# Patient Record
Sex: Female | Born: 1935 | Race: White | Hispanic: No | Marital: Married | Smoking: Never smoker
Health system: Southern US, Community
[De-identification: ages and names within clinical notes are randomized; demographics above are authoritative.]

## PROBLEM LIST (undated history)

## (undated) DIAGNOSIS — G309 Alzheimer's disease, unspecified: Secondary | ICD-10-CM

## (undated) DIAGNOSIS — F0280 Dementia in other diseases classified elsewhere without behavioral disturbance: Secondary | ICD-10-CM

## (undated) DIAGNOSIS — E785 Hyperlipidemia, unspecified: Secondary | ICD-10-CM

## (undated) DIAGNOSIS — F329 Major depressive disorder, single episode, unspecified: Secondary | ICD-10-CM

## (undated) DIAGNOSIS — I10 Essential (primary) hypertension: Secondary | ICD-10-CM

## (undated) DIAGNOSIS — F32A Depression, unspecified: Secondary | ICD-10-CM

---

## 2009-10-20 ENCOUNTER — Emergency Department (HOSPITAL_COMMUNITY): Admission: EM | Admit: 2009-10-20 | Discharge: 2009-10-20 | Payer: Self-pay | Admitting: Emergency Medicine

## 2010-03-01 ENCOUNTER — Emergency Department (HOSPITAL_BASED_OUTPATIENT_CLINIC_OR_DEPARTMENT_OTHER): Admission: EM | Admit: 2010-03-01 | Discharge: 2010-03-01 | Payer: Self-pay | Admitting: Emergency Medicine

## 2010-05-01 ENCOUNTER — Emergency Department (HOSPITAL_BASED_OUTPATIENT_CLINIC_OR_DEPARTMENT_OTHER): Admission: EM | Admit: 2010-05-01 | Discharge: 2010-05-01 | Payer: Self-pay | Admitting: Emergency Medicine

## 2011-06-10 ENCOUNTER — Emergency Department (HOSPITAL_COMMUNITY): Payer: Medicare Other

## 2011-06-10 ENCOUNTER — Emergency Department (HOSPITAL_COMMUNITY)
Admission: EM | Admit: 2011-06-10 | Discharge: 2011-06-10 | Disposition: A | Discharge status: Home / Self Care | Payer: Medicare Other | Attending: Emergency Medicine | Admitting: Emergency Medicine

## 2011-06-10 DIAGNOSIS — R51 Headache: Secondary | ICD-10-CM | POA: Insufficient documentation

## 2011-06-10 DIAGNOSIS — M949 Disorder of cartilage, unspecified: Secondary | ICD-10-CM | POA: Insufficient documentation

## 2011-06-10 DIAGNOSIS — Z23 Encounter for immunization: Secondary | ICD-10-CM | POA: Insufficient documentation

## 2011-06-10 DIAGNOSIS — F0390 Unspecified dementia without behavioral disturbance: Secondary | ICD-10-CM | POA: Insufficient documentation

## 2011-06-10 DIAGNOSIS — Y9210 Unspecified residential institution as the place of occurrence of the external cause: Secondary | ICD-10-CM | POA: Insufficient documentation

## 2011-06-10 DIAGNOSIS — W19XXXA Unspecified fall, initial encounter: Secondary | ICD-10-CM | POA: Insufficient documentation

## 2011-06-10 DIAGNOSIS — S0100XA Unspecified open wound of scalp, initial encounter: Secondary | ICD-10-CM | POA: Insufficient documentation

## 2011-06-10 DIAGNOSIS — M19049 Primary osteoarthritis, unspecified hand: Secondary | ICD-10-CM | POA: Insufficient documentation

## 2011-06-10 DIAGNOSIS — M899 Disorder of bone, unspecified: Secondary | ICD-10-CM | POA: Insufficient documentation

## 2011-06-10 DIAGNOSIS — M47812 Spondylosis without myelopathy or radiculopathy, cervical region: Secondary | ICD-10-CM | POA: Insufficient documentation

## 2011-06-10 DIAGNOSIS — T148XXA Other injury of unspecified body region, initial encounter: Secondary | ICD-10-CM | POA: Insufficient documentation

## 2013-07-23 ENCOUNTER — Emergency Department (HOSPITAL_COMMUNITY)
Admission: EM | Admit: 2013-07-23 | Discharge: 2013-07-23 | Disposition: A | Discharge status: Home / Self Care | Payer: Medicare Other | Attending: Emergency Medicine | Admitting: Emergency Medicine

## 2013-07-23 ENCOUNTER — Encounter (HOSPITAL_COMMUNITY): Payer: Self-pay | Admitting: Emergency Medicine

## 2013-07-23 DIAGNOSIS — Y9389 Activity, other specified: Secondary | ICD-10-CM | POA: Insufficient documentation

## 2013-07-23 DIAGNOSIS — F0390 Unspecified dementia without behavioral disturbance: Secondary | ICD-10-CM | POA: Insufficient documentation

## 2013-07-23 DIAGNOSIS — W1809XA Striking against other object with subsequent fall, initial encounter: Secondary | ICD-10-CM | POA: Insufficient documentation

## 2013-07-23 DIAGNOSIS — S6990XA Unspecified injury of unspecified wrist, hand and finger(s), initial encounter: Secondary | ICD-10-CM | POA: Insufficient documentation

## 2013-07-23 DIAGNOSIS — Z79899 Other long term (current) drug therapy: Secondary | ICD-10-CM | POA: Insufficient documentation

## 2013-07-23 DIAGNOSIS — W19XXXA Unspecified fall, initial encounter: Secondary | ICD-10-CM

## 2013-07-23 DIAGNOSIS — F0280 Dementia in other diseases classified elsewhere without behavioral disturbance: Secondary | ICD-10-CM | POA: Insufficient documentation

## 2013-07-23 DIAGNOSIS — G309 Alzheimer's disease, unspecified: Secondary | ICD-10-CM | POA: Insufficient documentation

## 2013-07-23 DIAGNOSIS — I10 Essential (primary) hypertension: Secondary | ICD-10-CM | POA: Insufficient documentation

## 2013-07-23 DIAGNOSIS — Y9210 Unspecified residential institution as the place of occurrence of the external cause: Secondary | ICD-10-CM | POA: Insufficient documentation

## 2013-07-23 HISTORY — DX: Essential (primary) hypertension: I10

## 2013-07-23 HISTORY — DX: Alzheimer's disease, unspecified: G30.9

## 2013-07-23 HISTORY — DX: Dementia in other diseases classified elsewhere, unspecified severity, without behavioral disturbance, psychotic disturbance, mood disturbance, and anxiety: F02.80

## 2013-07-23 MED ORDER — DIPHENHYDRAMINE HCL 50 MG/ML IJ SOLN
25.0000 mg | Freq: Once | INTRAMUSCULAR | Status: AC
  Administered 2013-07-23: 25 mg via INTRAVENOUS
  Filled 2013-07-23: qty 1

## 2013-07-23 MED ORDER — HALOPERIDOL LACTATE 5 MG/ML IJ SOLN
1.0000 mg | Freq: Once | INTRAMUSCULAR | Status: AC
  Administered 2013-07-23: 1 mg via INTRAMUSCULAR
  Filled 2013-07-23: qty 1

## 2013-07-23 MED ORDER — LORAZEPAM 2 MG/ML IJ SOLN
1.0000 mg | Freq: Once | INTRAMUSCULAR | Status: AC
  Administered 2013-07-23: 1 mg via INTRAMUSCULAR
  Filled 2013-07-23: qty 1

## 2013-07-23 NOTE — ED Provider Notes (Signed)
CSN: [id number]     Arrival date & time 07/23/13  2010 History     First MD Initiated Contact with Patient 07/23/13 2014     Chief Complaint  Patient presents with  . Fall   (Consider location/radiation/quality/duration/timing/severity/associated sxs/prior Treatment) HPI Comments: Patient with a history of Alzheimer's presents today from [facility] after falling and hitting her head just prior to arrival.  The fall was witnessed by staff at the Nursing Home.  According to the staff the patient was combative with the staff at the time of the fall.  She did hit her head, but did not lose consciousness.  Review of medications shows that the patient is currently not on any blood thinning medications.  She has a small skin tear of her right elbow, but no other obvious injury.  She denies any pain at this time.  Patient is a 79 y.o. female presenting with fall. History provided by: medical records from the nursing home.  Fall    Past Medical History  Diagnosis Date  . Alzheimer disease   . Hypertension    History reviewed. No pertinent past surgical history. History reviewed. No pertinent family history. History  Substance Use Topics  . Smoking status: Never Smoker   . Smokeless tobacco: Never Used  . Alcohol Use: No   OB History   Grav Para Term Preterm Abortions TAB SAB Ect Mult Living                 Review of Systems  Unable to perform ROS: Dementia    Allergies  Review of patient's allergies indicates no known allergies.  Home Medications   Current Outpatient Rx  Name  Route  Sig  Dispense  Refill  . acetaminophen (TYLENOL) 500 MG tablet   Oral   Take 500 mg by mouth 2 (two) times daily.         Marland Kitchen atenolol (TENORMIN) 25 MG tablet   Oral   Take 12.5 mg by mouth at bedtime. TAKE 1/2 TABLET         . divalproex (DEPAKOTE SPRINKLE) 125 MG capsule   Oral   Take 250 mg by mouth daily.         Marland Kitchen ENSURE PLUS (ENSURE PLUS) LIQD   Oral   Take  237 mLs by mouth.         . ferrous sulfate 220 (44 FE) MG/5ML solution   Oral   Take 330 mg by mouth daily.         . hydrOXYzine (ATARAX/VISTARIL) 10 MG tablet   Oral   Take 10 mg by mouth every 8 (eight) hours as needed for itching (ITCHING).         . mirtazapine (REMERON) 7.5 MG tablet   Oral   Take 7.5 mg by mouth at bedtime.         Marland Kitchen QUEtiapine (SEROQUEL) 25 MG tablet   Oral   Take 12.5 mg by mouth at bedtime. TAKE 1/2 TABLET          BP 138/68  Pulse 82  Temp(Src) 99.4 F (37.4 C) (Oral)  Resp 16  SpO2 99% Physical Exam  Nursing note and vitals reviewed. Constitutional: She appears well-developed and well-nourished.  HENT:  Head: Normocephalic and atraumatic.  Mouth/Throat: Oropharynx is clear and moist.  Eyes: EOM are normal. Pupils are equal, round, and reactive to light.  Cardiovascular: Normal rate, regular rhythm and normal heart sounds.   Pulmonary/Chest: Effort normal and breath  sounds normal.  Musculoskeletal: Normal range of motion.       Cervical back: She exhibits normal range of motion, no tenderness, no bony tenderness, no swelling, no edema and no deformity.       Thoracic back: She exhibits normal range of motion, no tenderness, no bony tenderness, no swelling, no edema and no deformity.       Lumbar back: She exhibits normal range of motion, no tenderness, no bony tenderness, no swelling, no edema and no deformity.  Full ROM of all extremities without pain  Neurological: She is alert. She has normal strength. No cranial nerve deficit.  Skin: Skin is warm and dry. No bruising and no ecchymosis noted.     Psychiatric: She has a normal mood and affect.    ED Course   Procedures (including critical care time)  Labs Reviewed - No data to display No results found. No diagnosis found.  Patient discussed with Dr. Silverio Lay who also evaluated the patient.  MDM  Patient presents after a witnessed fall that occurred at Nursing Home just prior  to arrival.  Fall occurred while she was being combative with staff and seems to be mechanical. No evidence of head trauma on exam.  Normal neurological exam.  She is not on any blood thinning medications.  Therefore, do not feel that imaging is indicated at this time.  She has full ROM of all extremities without pain.  Patient became combative during ED course and was given Haldol and Ativan in the ED.  Pascal Lux Bennett, PA-C 07/23/13 2142

## 2013-07-23 NOTE — ED Notes (Signed)
Pt given laundry to fold at this time, pt sitting right beside NS to keep pt in best visual field to prevent fall.

## 2013-07-23 NOTE — ED Notes (Signed)
[id number]<AV> Expected date:<BR> Expected time:<BR> Means of arrival:<BR> Comments:<BR> EMS/79 yo female-combative

## 2013-07-23 NOTE — ED Notes (Signed)
PTAR called  

## 2013-07-23 NOTE — ED Notes (Signed)
Per EMS, pt had a witnessed fall in a doorway, hitting her head.  Pt denies pain.  Pt from [facility] and is on a floor for dementia. Pt is alert but not oriented to place, time or situation.  Per staff, pt was being physically aggressive when the fall occurred.

## 2013-07-26 NOTE — ED Provider Notes (Signed)
Medical screening examination/treatment/procedure(s) were conducted as a shared visit with non-physician practitioner(s) and myself.  I personally evaluated the patient during the encounter  Rebekah Mcdowell is a 79 y.o. female hx of dementia here with agitation and fall. Was combative at nursing home and then hit her head. Not on anticoagulation and no scalp hematoma. No need for imaging and her behavior is baseline. Given meds for agitation, stable for transfer back to nursing home.    Richardean Canal, MD 07/26/13 [phone]

## 2013-08-12 ENCOUNTER — Emergency Department (HOSPITAL_COMMUNITY)
Admission: EM | Admit: 2013-08-12 | Discharge: 2013-08-12 | Disposition: A | Discharge status: Other Acute Inpatient Hospital | Payer: Medicare Other | Attending: Emergency Medicine | Admitting: Emergency Medicine

## 2013-08-12 ENCOUNTER — Emergency Department (HOSPITAL_COMMUNITY): Payer: Medicare Other

## 2013-08-12 ENCOUNTER — Encounter (HOSPITAL_COMMUNITY): Payer: Self-pay | Admitting: Emergency Medicine

## 2013-08-12 DIAGNOSIS — X58XXXA Exposure to other specified factors, initial encounter: Secondary | ICD-10-CM | POA: Insufficient documentation

## 2013-08-12 DIAGNOSIS — F919 Conduct disorder, unspecified: Secondary | ICD-10-CM | POA: Insufficient documentation

## 2013-08-12 DIAGNOSIS — S51811A Laceration without foreign body of right forearm, initial encounter: Secondary | ICD-10-CM

## 2013-08-12 DIAGNOSIS — Y939 Activity, unspecified: Secondary | ICD-10-CM | POA: Insufficient documentation

## 2013-08-12 DIAGNOSIS — Y929 Unspecified place or not applicable: Secondary | ICD-10-CM | POA: Insufficient documentation

## 2013-08-12 DIAGNOSIS — F0280 Dementia in other diseases classified elsewhere without behavioral disturbance: Secondary | ICD-10-CM | POA: Insufficient documentation

## 2013-08-12 DIAGNOSIS — I10 Essential (primary) hypertension: Secondary | ICD-10-CM | POA: Insufficient documentation

## 2013-08-12 DIAGNOSIS — S51809A Unspecified open wound of unspecified forearm, initial encounter: Secondary | ICD-10-CM | POA: Insufficient documentation

## 2013-08-12 DIAGNOSIS — Z23 Encounter for immunization: Secondary | ICD-10-CM | POA: Insufficient documentation

## 2013-08-12 DIAGNOSIS — R4689 Other symptoms and signs involving appearance and behavior: Secondary | ICD-10-CM

## 2013-08-12 DIAGNOSIS — G309 Alzheimer's disease, unspecified: Secondary | ICD-10-CM | POA: Insufficient documentation

## 2013-08-12 LAB — CBC
HCT: 37.1 % (ref 36.0–46.0)
Hemoglobin: 11.9 g/dL — ABNORMAL LOW (ref 12.0–15.0)
WBC: 7.3 10*3/uL (ref 4.0–10.5)

## 2013-08-12 LAB — URINALYSIS, ROUTINE W REFLEX MICROSCOPIC
Bilirubin Urine: NEGATIVE
Nitrite: NEGATIVE
Specific Gravity, Urine: 1.029 (ref 1.005–1.030)
pH: 5.5 (ref 5.0–8.0)

## 2013-08-12 LAB — BASIC METABOLIC PANEL
BUN: 25 mg/dL — ABNORMAL HIGH (ref 6–23)
Chloride: 113 mEq/L — ABNORMAL HIGH (ref 96–112)
GFR calc Af Amer: 87 mL/min — ABNORMAL LOW (ref >90)
Glucose, Bld: 116 mg/dL — ABNORMAL HIGH (ref 70–99)
Potassium: 3.8 mEq/L (ref 3.5–5.1)
Sodium: 146 mEq/L — ABNORMAL HIGH (ref 135–145)

## 2013-08-12 LAB — URINE MICROSCOPIC-ADD ON

## 2013-08-12 MED ORDER — TETANUS-DIPHTH-ACELL PERTUSSIS 5-2.5-18.5 LF-MCG/0.5 IM SUSP
0.5000 mL | Freq: Once | INTRAMUSCULAR | Status: AC
  Administered 2013-08-12: 0.5 mL via INTRAMUSCULAR
  Filled 2013-08-12: qty 0.5

## 2013-08-12 NOTE — ED Notes (Signed)
Bed: ZO10 Expected date:  Expected time:  Means of arrival:  Comments: Hold ems laceration

## 2013-08-12 NOTE — ED Provider Notes (Signed)
CSN: [id number]     Arrival date & time 08/12/13  1447 History     First MD Initiated Contact with Patient 08/12/13 1524     Chief Complaint  Patient presents with  . Extremity Laceration    right wrist   (Consider location/radiation/quality/duration/timing/severity/associated sxs/prior Treatment) Patient is a 79 y.o. female presenting with skin laceration. The history is provided by the EMS personnel and the nursing home.  Laceration Location:  Shoulder/arm Shoulder/arm laceration location:  R forearm Length (cm):  8 Depth:  Cutaneous Quality: straight   Bleeding: controlled   Laceration mechanism:  Unable to specify Pain details:    Quality:  Aching   Severity:  Mild   Timing:  Constant   Progression:  Worsening Foreign body present:  No foreign bodies Relieved by:  Nothing Worsened by:  Nothing tried Ineffective treatments:  None tried Tetanus status:  Unknown   Past Medical History  Diagnosis Date  . Alzheimer disease   . Hypertension    History reviewed. No pertinent past surgical history. No family history on file. History  Substance Use Topics  . Smoking status: Never Smoker   . Smokeless tobacco: Never Used  . Alcohol Use: No   OB History   Grav Para Term Preterm Abortions TAB SAB Ect Mult Living                 Review of Systems  Constitutional: Negative for fever and chills.  Respiratory: Negative for cough and shortness of breath.   All other systems reviewed and are negative.    Allergies  Review of patient's allergies indicates no known allergies.  Home Medications   Current Outpatient Rx  Name  Route  Sig  Dispense  Refill  . acetaminophen (TYLENOL) 500 MG tablet   Oral   Take 500 mg by mouth 2 (two) times daily.         Marland Kitchen atenolol (TENORMIN) 25 MG tablet   Oral   Take 12.5 mg by mouth daily.         Marland Kitchen ENSURE PLUS (ENSURE PLUS) LIQD   Oral   Take 237 mLs by mouth.         . ferrous sulfate 220 (44 FE) MG/5ML solution  Oral   Take 330 mg by mouth daily.         . hydrOXYzine (ATARAX/VISTARIL) 10 MG tablet   Oral   Take 10 mg by mouth every 8 (eight) hours as needed for itching (ITCHING).         . mirtazapine (REMERON) 7.5 MG tablet   Oral   Take 7.5 mg by mouth at bedtime.         Marland Kitchen QUEtiapine (SEROQUEL) 25 MG tablet   Oral   Take 25 mg by mouth at bedtime.          . cephALEXin (KEFLEX) 500 MG capsule   Oral   Take 500 mg by mouth 3 (three) times daily. 10 days - started 07/30/2013          BP 112/54  Pulse 84  Temp(Src) 97.7 F (36.5 C) (Oral)  Resp 18  SpO2 98% Physical Exam  Nursing note and vitals reviewed. Constitutional: She appears well-developed and well-nourished. No distress.  HENT:  Head: Normocephalic and atraumatic.  Eyes: EOM are normal. Pupils are equal, round, and reactive to light.  Neck: Normal range of motion. Neck supple.  Cardiovascular: Normal rate and regular rhythm.  Exam reveals no friction rub.   No  murmur heard. Pulmonary/Chest: Effort normal and breath sounds normal. No respiratory distress. She has no wheezes. She has no rales.  Abdominal: Soft. She exhibits no distension. There is no tenderness. There is no rebound.  Musculoskeletal: She exhibits no edema.       Right wrist: She exhibits laceration (8 cm volar). She exhibits normal range of motion, no tenderness, no crepitus and no deformity.       Arms: Neurological: She is alert. She exhibits normal muscle tone. Coordination normal.  Skin: She is not diaphoretic.    ED Course   LACERATION REPAIR Date/Time: 08/12/2013 5:00 PM Performed by: Dagmar Hait Authorized by: Dagmar Hait Consent: Verbal consent obtained. Body area: upper extremity Location details: right lower arm Laceration length: 8 cm Foreign bodies: no foreign bodies Tendon involvement: none Nerve involvement: none Vascular damage: no Anesthesia: local infiltration Local anesthetic: lidocaine 1% with  epinephrine Anesthetic total: 3 ml Patient sedated: no Preparation: Patient was prepped and draped in the usual sterile fashion. Irrigation solution: saline Amount of cleaning: standard Debridement: none Degree of undermining: none Skin closure: 3-0 Prolene Number of sutures: 10 Technique: simple Approximation: close Approximation difficulty: simple Dressing: 4x4 sterile gauze Patient tolerance: Patient tolerated the procedure well with no immediate complications.   (including critical care time)  Labs Reviewed - No data to display Dg Chest [facility]  08/12/2013   CLINICAL DATA:  Combative. Hypertension.  EXAM: PORTABLE CHEST - 1 VIEW  COMPARISON:  None.  FINDINGS: Borderline enlarged cardiac silhouette. Clear lungs. Diffuse osteopenia and mild scoliosis.  IMPRESSION: No acute abnormality.   Electronically Signed   By: Gordan Payment   On: 08/12/2013 17:11   1. Combative behavoir   2. Forearm laceration, right, initial encounter      Date: 08/12/2013  Rate: 91  Rhythm: normal sinus rhythm  QRS Axis: normal  Intervals: normal  ST/T Wave abnormalities: normal  Conduction Disutrbances:none  Narrative Interpretation:   Old EKG Reviewed: none available   MDM  79F presents with extremity laceration. Unknown circumstances. Unable to get full story. Combative with EMS, received versed x 2 and Haldol for sedation. Patient here, cooperative, no further lacerations or injuries identified. Patient sustained R volar forearm laceration, no tendon or vascular involvement. NVI distally. Repair as above.  I spoke with Nursing Home who stated she was combative today. She occasionally gets combative. Will check labs to ensure on infection. Unknown how skin tear on her arm occurred.  Labs normal. Lac repair as above.   Dagmar Hait, MD 08/12/13 [phone]

## 2013-08-12 NOTE — ED Notes (Addendum)
Per EMS: pt presents with laceration to right wrist. Pt fighting with staff at [facility]. 5 mg Versed IM, 2.5 mg Versed IV in left forearm, 7.5 mg Haldol IM.

## 2013-09-26 ENCOUNTER — Emergency Department (HOSPITAL_COMMUNITY): Payer: Medicare Other

## 2013-09-26 ENCOUNTER — Emergency Department (HOSPITAL_COMMUNITY)
Admission: EM | Admit: 2013-09-26 | Discharge: 2013-09-26 | Disposition: A | Discharge status: Home / Self Care | Payer: Medicare Other | Attending: Emergency Medicine | Admitting: Emergency Medicine

## 2013-09-26 ENCOUNTER — Encounter (HOSPITAL_COMMUNITY): Payer: Self-pay | Admitting: Emergency Medicine

## 2013-09-26 DIAGNOSIS — F0280 Dementia in other diseases classified elsewhere without behavioral disturbance: Secondary | ICD-10-CM | POA: Insufficient documentation

## 2013-09-26 DIAGNOSIS — Z862 Personal history of diseases of the blood and blood-forming organs and certain disorders involving the immune mechanism: Secondary | ICD-10-CM | POA: Insufficient documentation

## 2013-09-26 DIAGNOSIS — Z8639 Personal history of other endocrine, nutritional and metabolic disease: Secondary | ICD-10-CM | POA: Insufficient documentation

## 2013-09-26 DIAGNOSIS — Z79899 Other long term (current) drug therapy: Secondary | ICD-10-CM | POA: Insufficient documentation

## 2013-09-26 DIAGNOSIS — G309 Alzheimer's disease, unspecified: Secondary | ICD-10-CM | POA: Insufficient documentation

## 2013-09-26 DIAGNOSIS — Z043 Encounter for examination and observation following other accident: Secondary | ICD-10-CM | POA: Insufficient documentation

## 2013-09-26 DIAGNOSIS — W19XXXA Unspecified fall, initial encounter: Secondary | ICD-10-CM | POA: Insufficient documentation

## 2013-09-26 DIAGNOSIS — Y939 Activity, unspecified: Secondary | ICD-10-CM | POA: Insufficient documentation

## 2013-09-26 DIAGNOSIS — I10 Essential (primary) hypertension: Secondary | ICD-10-CM | POA: Insufficient documentation

## 2013-09-26 DIAGNOSIS — F329 Major depressive disorder, single episode, unspecified: Secondary | ICD-10-CM | POA: Insufficient documentation

## 2013-09-26 DIAGNOSIS — Y92009 Unspecified place in unspecified non-institutional (private) residence as the place of occurrence of the external cause: Secondary | ICD-10-CM | POA: Insufficient documentation

## 2013-09-26 DIAGNOSIS — F3289 Other specified depressive episodes: Secondary | ICD-10-CM | POA: Insufficient documentation

## 2013-09-26 HISTORY — DX: Depression, unspecified: F32.A

## 2013-09-26 HISTORY — DX: Hyperlipidemia, unspecified: E78.5

## 2013-09-26 HISTORY — DX: Major depressive disorder, single episode, unspecified: F32.9

## 2013-09-26 LAB — CBC WITH DIFFERENTIAL/PLATELET
Basophils Absolute: 0.1 10*3/uL (ref 0.0–0.1)
Basophils Relative: 1 % (ref 0–1)
Eosinophils Absolute: 1.2 10*3/uL — ABNORMAL HIGH (ref 0.0–0.7)
Eosinophils Relative: 15 % — ABNORMAL HIGH (ref 0–5)
HCT: 37.4 % (ref 36.0–46.0)
Hemoglobin: 12.2 g/dL (ref 12.0–15.0)
Lymphocytes Relative: 34 % (ref 12–46)
MCH: 30.5 pg (ref 26.0–34.0)
MCHC: 32.6 g/dL (ref 30.0–36.0)
MCV: 93.5 fL (ref 78.0–100.0)
Monocytes Absolute: 0.7 10*3/uL (ref 0.1–1.0)
Monocytes Relative: 9 % (ref 3–12)
Neutro Abs: 3.2 10*3/uL (ref 1.7–7.7)
RDW: 13.5 % (ref 11.5–15.5)
WBC: 7.8 10*3/uL (ref 4.0–10.5)

## 2013-09-26 LAB — COMPREHENSIVE METABOLIC PANEL
AST: 31 U/L (ref 0–37)
Albumin: 3.8 g/dL (ref 3.5–5.2)
BUN: 29 mg/dL — ABNORMAL HIGH (ref 6–23)
Calcium: 9.5 mg/dL (ref 8.4–10.5)
Chloride: 105 mEq/L (ref 96–112)
Creatinine, Ser: 0.79 mg/dL (ref 0.50–1.10)
GFR calc Af Amer: 84 mL/min — ABNORMAL LOW (ref >90)
GFR calc non Af Amer: 72 mL/min — ABNORMAL LOW (ref >90)
Glucose, Bld: 93 mg/dL (ref 70–99)
Total Bilirubin: 0.2 mg/dL — ABNORMAL LOW (ref 0.3–1.2)

## 2013-09-26 LAB — VALPROIC ACID LEVEL: Valproic Acid Lvl: <10 ug/mL — ABNORMAL LOW (ref 50.0–100.0)

## 2013-09-26 LAB — POCT I-STAT TROPONIN I: Troponin i, poc: 0.01 ng/mL (ref 0.00–0.08)

## 2013-09-26 MED ORDER — LORAZEPAM 2 MG/ML IJ SOLN
1.0000 mg | Freq: Once | INTRAMUSCULAR | Status: AC
  Administered 2013-09-26: 1 mg via INTRAMUSCULAR
  Filled 2013-09-26: qty 1

## 2013-09-26 MED ORDER — HALOPERIDOL LACTATE 5 MG/ML IJ SOLN
2.0000 mg | Freq: Once | INTRAMUSCULAR | Status: AC
Start: 2013-09-26 — End: 2013-09-26
  Administered 2013-09-26: 2 mg via INTRAMUSCULAR
  Filled 2013-09-26: qty 1

## 2013-09-26 NOTE — ED Notes (Signed)
Report to [facility] personnel.  Pt to be transported back to [facility].

## 2013-09-26 NOTE — ED Notes (Signed)
Pt found in dining room. No witness to the fall. No LOC, no blood thinners. Hx of dementia. Staff states she has been acting different than her baseline.  Trigger word for combative behavior is "No".  Uses cane to ambulate on own.  Follows commands. Passed spinal cord assessment and walked to stretcher. (Shuffled feet) Equal grips and negative for stroke symptoms according to EMS. Inreased tremors in arms CBG-77 Bottom of- BP 74- HR 98% RA R-18

## 2013-09-26 NOTE — ED Notes (Signed)
PT attempting to get out of bed and leave. PT placed back in bed. Allayne Gitelman RN AD aware. EDP made aware

## 2013-09-26 NOTE — ED Notes (Signed)
PT verbally abusive to staff. Kindly reminded PT that she must stay in bed for her safety. PT states "shut up"

## 2013-09-26 NOTE — ED Provider Notes (Signed)
CSN: [id number]     Arrival date & time 09/26/13  1641 History   First MD Initiated Contact with Patient 09/26/13 1641     Chief Complaint  Patient presents with  . Fall   (Consider location/radiation/quality/duration/timing/severity/associated sxs/prior Treatment) The history is provided by the patient, the EMS personnel and the nursing home.  Rebekah Mcdowell is a 79 y.o. female history of hypertension, dementia here presenting with a witnessed fall. As per nursing home she was found on the floor. She denies falling or chest pain or shortness of breath or abdominal pain or hip pain. He said that she feels fine right now. She's been a nursing home had frequent falls.   Level V caveat- dementia    Past Medical History  Diagnosis Date  . Alzheimer disease   . Hypertension   . Depression   . Hyperlipidemia    History reviewed. No pertinent past surgical history. History reviewed. No pertinent family history. History  Substance Use Topics  . Smoking status: Never Smoker   . Smokeless tobacco: Never Used  . Alcohol Use: No   OB History   Grav Para Term Preterm Abortions TAB SAB Ect Mult Living                 Review of Systems  Unable to perform ROS: Dementia    Allergies  Review of patient's allergies indicates no known allergies.  Home Medications   Current Outpatient Rx  Name  Route  Sig  Dispense  Refill  . acetaminophen (TYLENOL) 500 MG tablet   Oral   Take 500 mg by mouth 2 (two) times daily.         . divalproex (DEPAKOTE SPRINKLE) 125 MG capsule   Oral   Take 250 mg by mouth 2 (two) times daily.         Marland Kitchen ENSURE PLUS (ENSURE PLUS) LIQD   Oral   Take 237 mLs by mouth.         . ferrous sulfate 220 (44 FE) MG/5ML solution   Oral   Take 330 mg by mouth daily.         . hydrOXYzine (ATARAX/VISTARIL) 10 MG tablet   Oral   Take 10 mg by mouth every 8 (eight) hours as needed for itching (ITCHING).         . LORazepam (ATIVAN) 1 MG tablet   Oral    Take 1 mg by mouth every 6 (six) hours as needed (agitation).         . QUEtiapine (SEROQUEL) 25 MG tablet   Oral   Take 12.5 mg by mouth every morning.          . triamcinolone cream (KENALOG) 0.1 %   Topical   Apply 1 application topically 2 (two) times daily. To rash          BP 121/47  Pulse 86  Temp(Src) 97.4 F (36.3 C) (Oral)  Resp 13  SpO2 97% Physical Exam  Nursing note and vitals reviewed. Constitutional:  Chronically ill, NAD   HENT:  Head: Normocephalic and atraumatic.  Mouth/Throat: Oropharynx is clear and moist.  Eyes: Conjunctivae are normal. Pupils are equal, round, and reactive to light.  Neck: Normal range of motion. Neck supple.  No midline tenderness   Cardiovascular: Normal rate, regular rhythm and normal heart sounds.   Pulmonary/Chest: Effort normal and breath sounds normal. No respiratory distress. She has no wheezes. She has no rales.  Abdominal: Soft. Bowel sounds are normal. She  exhibits no distension. There is no tenderness. There is no rebound and no guarding.  Musculoskeletal: Normal range of motion.  Pelvis stable, nl ROM hips   Neurological: She is alert.  Nl strength throughout.   Skin: Skin is warm and dry.  Psychiatric:  Unable     ED Course  Procedures (including critical care time) Labs Review Labs Reviewed  CBC WITH DIFFERENTIAL - Abnormal; Notable for the following:    Neutrophils Relative % 41 (*)    Eosinophils Relative 15 (*)    Eosinophils Absolute 1.2 (*)    All other components within normal limits  COMPREHENSIVE METABOLIC PANEL - Abnormal; Notable for the following:    BUN 29 (*)    Total Bilirubin 0.2 (*)    GFR calc non Af Amer 72 (*)    GFR calc Af Amer 84 (*)    All other components within normal limits  VALPROIC ACID LEVEL - Abnormal; Notable for the following:    Valproic Acid Lvl <10.0 (*)    All other components within normal limits  POCT I-STAT TROPONIN I   Imaging Review Ct Head Wo  Contrast  09/26/2013   CLINICAL DATA:  Fall, history of dementia  EXAM: CT HEAD WITHOUT CONTRAST  CT CERVICAL SPINE WITHOUT CONTRAST  TECHNIQUE: Multidetector CT imaging of the head and cervical spine was performed following the standard protocol without intravenous contrast. Multiplanar CT image reconstructions of the cervical spine were also generated.  COMPARISON:  Most recent prior CT head and cervical spine series 622 2012  FINDINGS: CT HEAD FINDINGS  Negative for acute intracranial hemorrhage, acute infarction, mass, mass effect, hydrocephalus or midline shift. Gray-white differentiation is preserved throughout. A stable global atrophy with compensatory ex vacuo dilatation of the lateral ventricles and a mild chronic microvascular ischemic white matter changes. Small lacunar infarct versus dilated perivascular space in the right basal ganglia. No focal soft tissue abnormality or scalp hematoma. Globes and orbits are intact and symmetric bilaterally. No acute calvarial abnormality. Normal aeration of bilateral mastoid air cells and paranasal sinuses. Atherosclerotic calcifications of the bilateral internal carotid arteries.  CT CERVICAL SPINE FINDINGS  No acute fracture, malalignment or prevertebral soft tissue swelling. Unremarkable CT appearance of the thyroid gland. No acute soft tissue abnormality. Stable right apical pleural-parenchymal scarring dating back June of 2012. Mild multilevel cervical spondylosis with mild interval progression.  IMPRESSION: CT HEAD IMPRESSION  1. No acute intracranial abnormality. 2. Stable atrophy and mild chronic microvascular white matter disease  CT CERVICAL SPINE IMPRESSION  No acute fracture or malalignment.   Electronically Signed   By: Malachy Moan M.D.   On: 09/26/2013 19:14   Ct Cervical Spine Wo Contrast  09/26/2013   CLINICAL DATA:  Fall, history of dementia  EXAM: CT HEAD WITHOUT CONTRAST  CT CERVICAL SPINE WITHOUT CONTRAST  TECHNIQUE: Multidetector CT  imaging of the head and cervical spine was performed following the standard protocol without intravenous contrast. Multiplanar CT image reconstructions of the cervical spine were also generated.  COMPARISON:  Most recent prior CT head and cervical spine series 622 2012  FINDINGS: CT HEAD FINDINGS  Negative for acute intracranial hemorrhage, acute infarction, mass, mass effect, hydrocephalus or midline shift. Gray-white differentiation is preserved throughout. A stable global atrophy with compensatory ex vacuo dilatation of the lateral ventricles and a mild chronic microvascular ischemic white matter changes. Small lacunar infarct versus dilated perivascular space in the right basal ganglia. No focal soft tissue abnormality or scalp hematoma. Globes and  orbits are intact and symmetric bilaterally. No acute calvarial abnormality. Normal aeration of bilateral mastoid air cells and paranasal sinuses. Atherosclerotic calcifications of the bilateral internal carotid arteries.  CT CERVICAL SPINE FINDINGS  No acute fracture, malalignment or prevertebral soft tissue swelling. Unremarkable CT appearance of the thyroid gland. No acute soft tissue abnormality. Stable right apical pleural-parenchymal scarring dating back June of 2012. Mild multilevel cervical spondylosis with mild interval progression.  IMPRESSION: CT HEAD IMPRESSION  1. No acute intracranial abnormality. 2. Stable atrophy and mild chronic microvascular white matter disease  CT CERVICAL SPINE IMPRESSION  No acute fracture or malalignment.   Electronically Signed   By: Malachy Moan M.D.   On: 09/26/2013 19:14    EKG Interpretation   None       Date: 09/26/2013  Rate: 72  Rhythm: normal sinus rhythm  QRS Axis: normal  Intervals: normal  ST/T Wave abnormalities: normal  Conduction Disutrbances:none  Narrative Interpretation:   Old EKG Reviewed: unchanged    MDM  No diagnosis found. Rebekah Mcdowell is a 79 y.o. female here with unwitness  fall. Will get CT head/neck. Will get labs and EKG.   7:31 PM Labs at baseline. CT head/neck unremarkable. She was agitated and given haldol and ativan (she had been agitated in the previous ED visits). I think this is her baseline. Stable for d/c back to nursing home.      Richardean Canal, MD 09/26/13 [phone]

## 2013-09-26 NOTE — ED Notes (Signed)
Attempted to call report to [facility] x 3.  Phone busy, was unable to reach anyone. Pt left via PTAR.

## 2013-09-26 NOTE — ED Notes (Signed)
Pt refused to get into gown and refused to let me perform EKG.

## 2013-09-26 NOTE — ED Notes (Signed)
Rebekah Mcdowell (daughter) [phone]

## 2013-09-26 NOTE — ED Notes (Signed)
Pt treatment completed.  To be discharged back to home facility. PTAR called by Unit Secretary.

## 2013-09-26 NOTE — ED Notes (Signed)
CT made aware PT medicated for exam

## 2013-11-02 IMAGING — CT CT CERVICAL SPINE W/O CM
4 of 5 series · 14 of 33 positions shown, 16 images · non-contrast
Comparison: Most recent prior CT head and cervical spine series [DATE]

CLINICAL DATA: Fall, history of dementia

EXAM:
CT HEAD WITHOUT CONTRAST
CT CERVICAL SPINE WITHOUT CONTRAST
TECHNIQUE: Multidetector CT imaging of the head and cervical spine was
performed following the standard protocol without intravenous
contrast. Multiplanar CT image reconstructions of the cervical spine
were also generated.

[Series 5: c_spine 2.0 i30s 3 · axial · 0.24mm/px · z∈[+1258,+1350]mm · 4 of 75 slices shown, 5 images]
[im 15/75  soft-tissue]
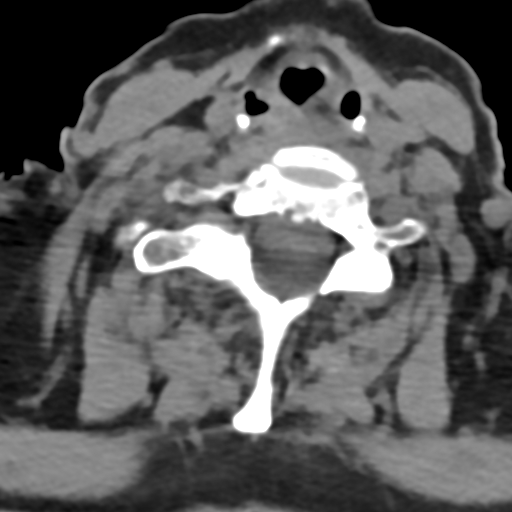
[im 15/75  bone]
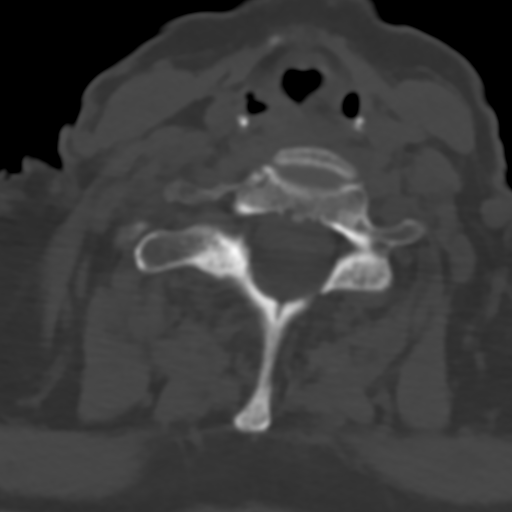
[im 30/75  bone]
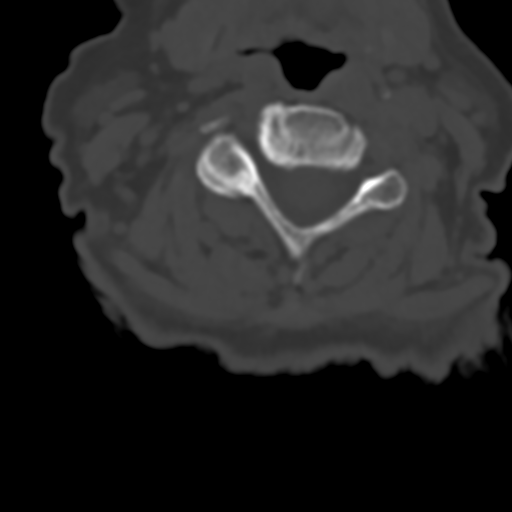
[im 45/75  bone]
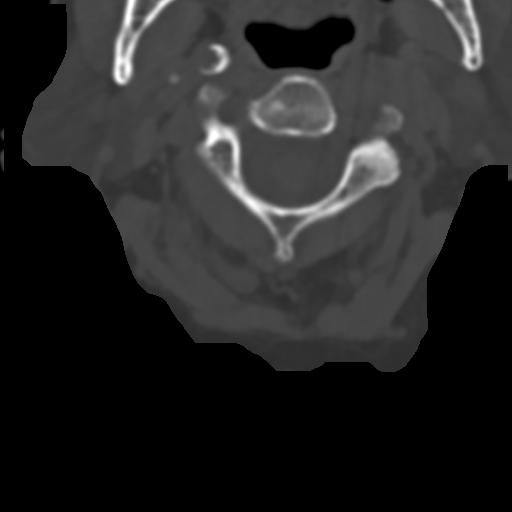
[im 60/75  bone]
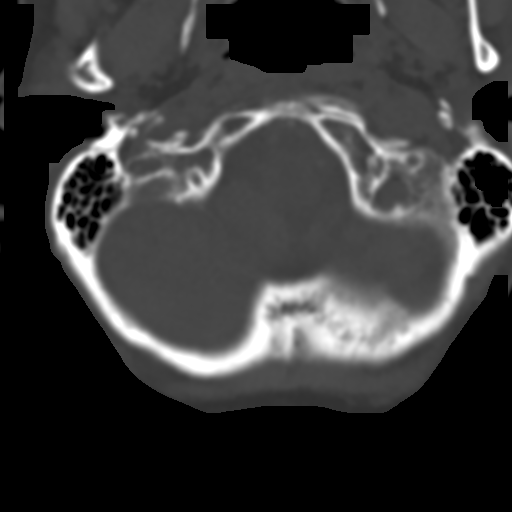

[Series 7: coronals · coronal · 0.24mm/px · 3 of 58 slices shown]
[im 12/58  bone]
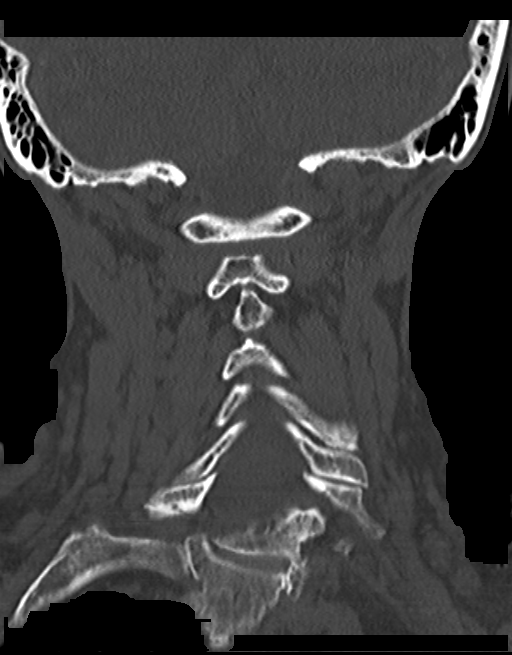
[im 23/58  bone]
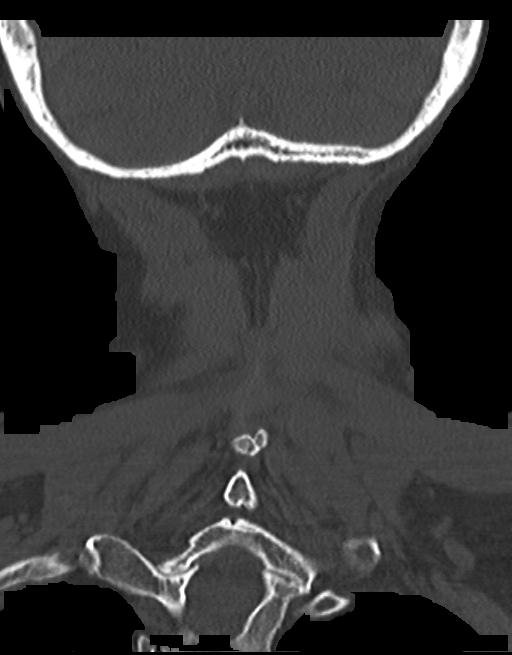
[im 35/58  bone]
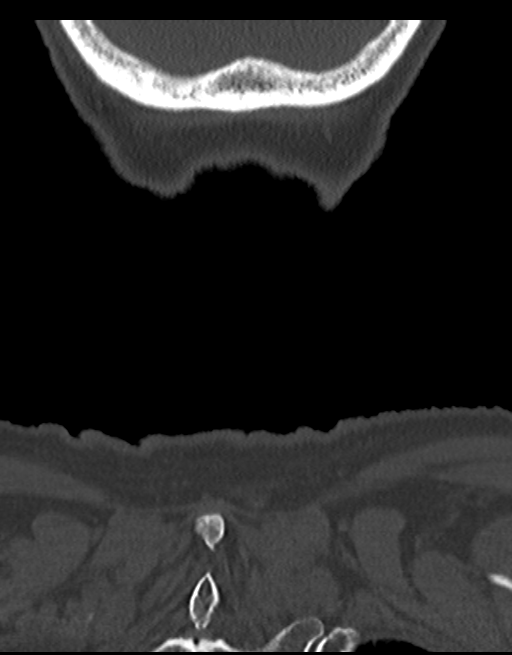

[Series 8: sagittals · sagittal · 0.22mm/px · 5 of 61 slices shown, 6 images]
[im 21/61  bone]
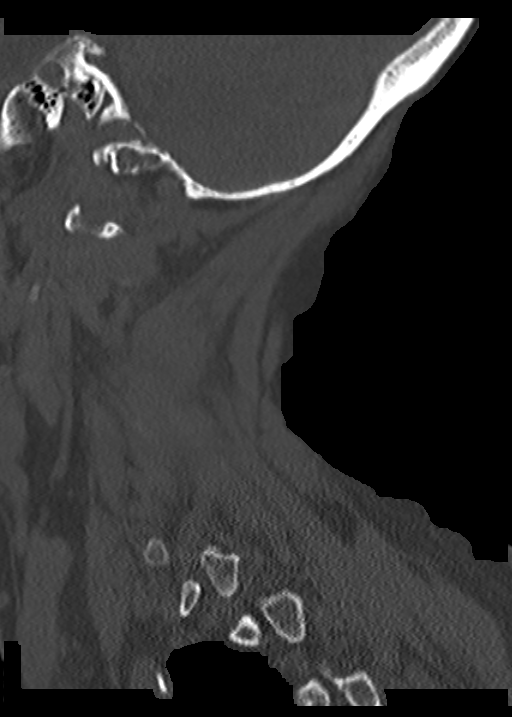
[im 26/61  bone]
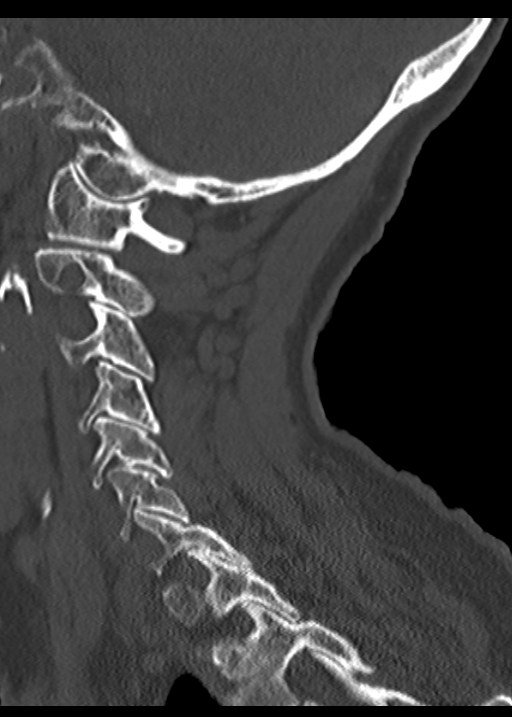
[im 31/61  soft-tissue]
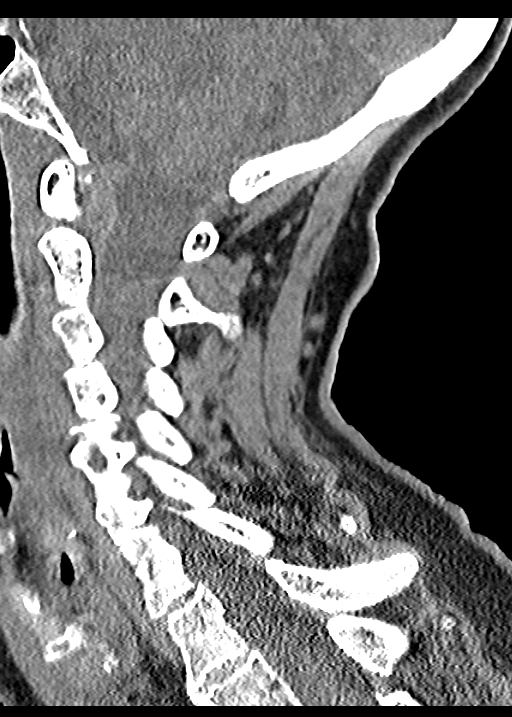
[im 31/61  bone]
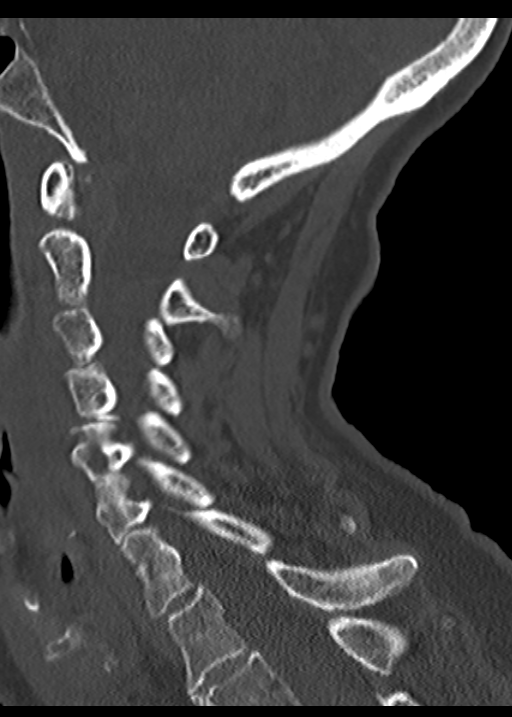
[im 36/61  bone]
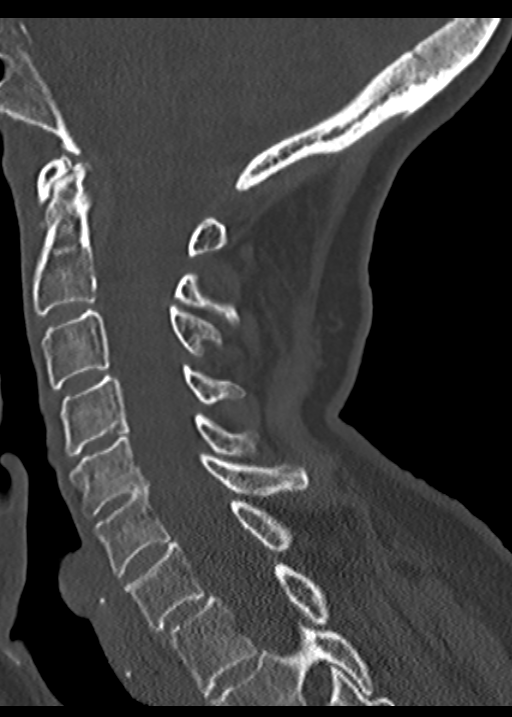
[im 41/61  bone]
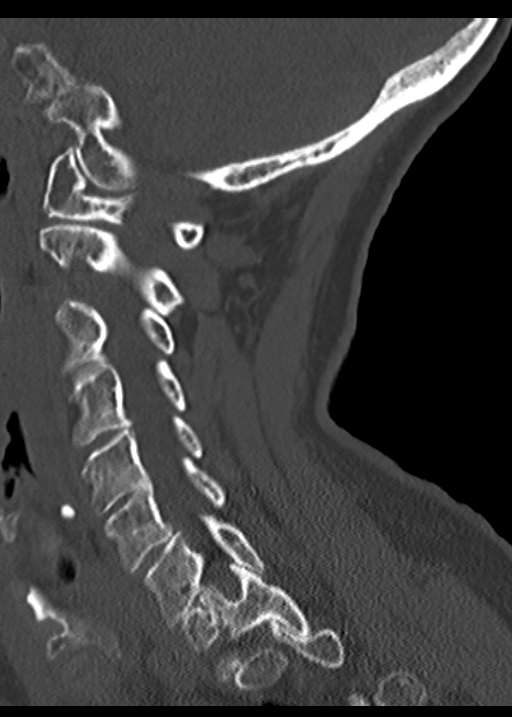

[Series 9: orthogonals · axial · 0.21mm/px · z∈[+1243,+1272]mm · 2 of 77 slices shown]
[im 16/77  bone]
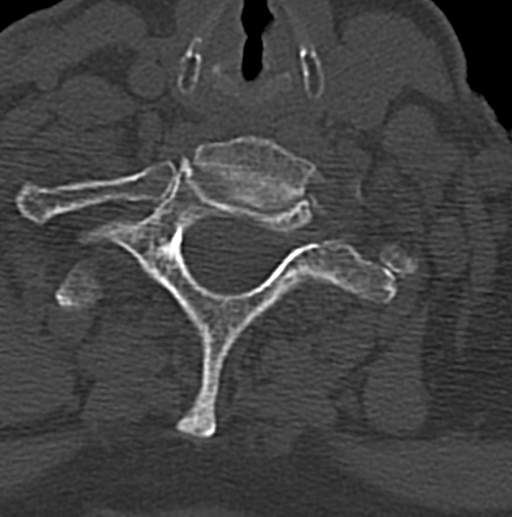
[im 31/77  bone]
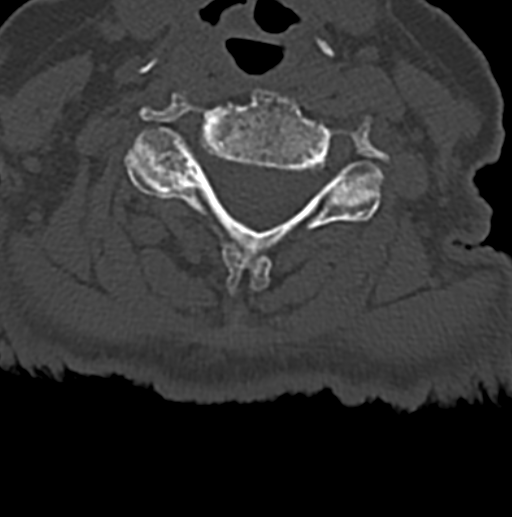

[14 of 33 positions shown; findings below may reference images not displayed]

FINDINGS: CT HEAD FINDINGS

Negative for acute intracranial hemorrhage, acute infarction, mass,
mass effect, hydrocephalus or midline shift. Gray-white
differentiation is preserved throughout. A stable global atrophy
with compensatory ex vacuo dilatation of the lateral ventricles and
a mild chronic microvascular ischemic white matter changes. Small
lacunar infarct versus dilated perivascular space in the right basal
ganglia. No focal soft tissue abnormality or scalp hematoma. Globes
and orbits are intact and symmetric bilaterally. No acute calvarial
abnormality. Normal aeration of bilateral mastoid air cells and
paranasal sinuses. Atherosclerotic calcifications of the bilateral
internal carotid arteries.

CT CERVICAL SPINE FINDINGS

No acute fracture, malalignment or prevertebral soft tissue
swelling. Unremarkable CT appearance of the thyroid gland. No acute
soft tissue abnormality. Stable right apical pleural-parenchymal
scarring dating back Friday May, 2011. Mild multilevel cervical
spondylosis with mild interval progression.
IMPRESSION: CT HEAD IMPRESSION

1. No acute intracranial abnormality.
2. Stable atrophy and mild chronic microvascular white matter
disease

CT CERVICAL SPINE IMPRESSION

No acute fracture or malalignment.

## 2015-10-22 ENCOUNTER — Encounter (HOSPITAL_COMMUNITY): Payer: Self-pay | Admitting: Emergency Medicine

## 2015-10-22 ENCOUNTER — Emergency Department (HOSPITAL_COMMUNITY): Payer: Medicare Other

## 2015-10-22 ENCOUNTER — Emergency Department (HOSPITAL_COMMUNITY)
Admission: EM | Admit: 2015-10-22 | Discharge: 2015-10-23 | Disposition: A | Discharge status: Home / Self Care | Payer: Medicare Other | Attending: Emergency Medicine | Admitting: Emergency Medicine

## 2015-10-22 DIAGNOSIS — W1839XA Other fall on same level, initial encounter: Secondary | ICD-10-CM | POA: Insufficient documentation

## 2015-10-22 DIAGNOSIS — F329 Major depressive disorder, single episode, unspecified: Secondary | ICD-10-CM | POA: Diagnosis not present

## 2015-10-22 DIAGNOSIS — S0990XA Unspecified injury of head, initial encounter: Secondary | ICD-10-CM | POA: Diagnosis present

## 2015-10-22 DIAGNOSIS — I10 Essential (primary) hypertension: Secondary | ICD-10-CM | POA: Insufficient documentation

## 2015-10-22 DIAGNOSIS — Y998 Other external cause status: Secondary | ICD-10-CM | POA: Diagnosis not present

## 2015-10-22 DIAGNOSIS — Z8669 Personal history of other diseases of the nervous system and sense organs: Secondary | ICD-10-CM | POA: Diagnosis not present

## 2015-10-22 DIAGNOSIS — Z79899 Other long term (current) drug therapy: Secondary | ICD-10-CM | POA: Diagnosis not present

## 2015-10-22 DIAGNOSIS — W19XXXA Unspecified fall, initial encounter: Secondary | ICD-10-CM

## 2015-10-22 DIAGNOSIS — Y9389 Activity, other specified: Secondary | ICD-10-CM | POA: Insufficient documentation

## 2015-10-22 DIAGNOSIS — Y9289 Other specified places as the place of occurrence of the external cause: Secondary | ICD-10-CM | POA: Insufficient documentation

## 2015-10-22 NOTE — ED Notes (Signed)
Patient was asleep and would not respond to any instructions.

## 2015-10-22 NOTE — Discharge Instructions (Signed)
Head CT negative for any skull fracture or brain injury. CT of neck without evidence of any bony injury. Was not the best study due to patient's uncooperativeness. If patient seems to have any persistent neck problems would recommend reevaluation. There was some question raised about potential ligamental injury. However patient's been walking around here and moving her neck around without any problems. Also no apparent tenderness to the neck.

## 2015-10-22 NOTE — ED Provider Notes (Addendum)
CSN: [id number]     Arrival date & time 10/22/15  2049 History   First MD Initiated Contact with Patient 10/22/15 2103     Chief Complaint  Patient presents with  . Fall     (Consider location/radiation/quality/duration/timing/severity/associated sxs/prior Treatment) Patient is a 81 y.o. female presenting with fall. The history is provided by the patient, the EMS personnel and the nursing home.  Fall   patient with history of Alzheimer's disease. Level V caveat applies. Patient from [facility] patient unwitnessed fall no known loss of consciousness. Patient's mental status is baseline according to the nursing facility. Patient was combative for EMS on the way in which is normal for her they gave her Haldol 5 mg. Patient EMS could not get a c-collar on her Cipro [place] around her neck for C-spine immobilization the patient took that off. Patient was able to orient the nursing facility.  Past Medical History  Diagnosis Date  . Alzheimer disease   . Hypertension   . Depression   . Hyperlipidemia    No past surgical history on file. No family history on file. Social History  Substance Use Topics  . Smoking status: Never Smoker   . Smokeless tobacco: Never Used  . Alcohol Use: No   OB History    No data available     Review of Systems  Reason unable to perform ROS: Level V caveat applies due to patient's history of Alzheimer's disease.      Allergies  Review of patient's allergies indicates no known allergies.  Home Medications   Prior to Admission medications   Medication Sig Start Date End Date Taking? Authorizing Provider  acetaminophen (TYLENOL) 500 MG tablet Take 500 mg by mouth 2 (two) times daily.   Yes Historical Provider, MD  atenolol (TENORMIN) 25 MG tablet Take 12.5 mg by mouth every Monday, Wednesday, and Friday.   Yes Historical Provider, MD  cholecalciferol (VITAMIN D) 400 UNITS TABS tablet Take 400 Units by mouth daily.   Yes Historical  Provider, MD  divalproex (DEPAKOTE SPRINKLE) 125 MG capsule Take 125 mg by mouth 2 (two) times daily.    Yes Historical Provider, MD  ENSURE PLUS (ENSURE PLUS) LIQD Take 237 mLs by mouth 2 (two) times daily between meals.    Yes Historical Provider, MD  ferrous sulfate 220 (44 FE) MG/5ML solution Take 330 mg by mouth daily.   Yes Historical Provider, MD  levothyroxine (SYNTHROID, LEVOTHROID) 25 MCG tablet Take 12.5 mcg by mouth daily before breakfast.   Yes Historical Provider, MD  loperamide (IMODIUM) 2 MG capsule Take 2 mg by mouth as needed for diarrhea or loose stools.   Yes Historical Provider, MD  LORazepam (ATIVAN) 0.5 MG tablet Take 0.5 mg by mouth 2 (two) times daily as needed (agitation).   Yes Historical Provider, MD  saccharomyces boulardii (FLORASTOR) 250 MG capsule Take 250 mg by mouth 2 (two) times daily.   Yes Historical Provider, MD   BP 112/90 mmHg  Pulse 84  Temp(Src) 98.1 F (36.7 C) (Oral)  Resp 16  SpO2 96% Physical Exam  Constitutional: She is oriented to person, place, and time. She appears well-developed and well-nourished.  HENT:  Head: Normocephalic.  Left for head with 3 x 3 cm contusion.  Eyes: Conjunctivae and EOM are normal. Pupils are equal, round, and reactive to light.  Neck: Normal range of motion. Neck supple.  Patient moving her head around spontaneously. Would not allow EMS to put c-collar on.  Cardiovascular: Normal rate, regular rhythm and normal heart sounds.   No murmur heard. Pulmonary/Chest: Effort normal and breath sounds normal. No respiratory distress.  Abdominal: Soft. Bowel sounds are normal. There is no tenderness.  Musculoskeletal: Normal range of motion.  Neurological: She is alert and oriented to person, place, and time. No cranial nerve deficit. She exhibits normal muscle tone. Coordination normal.  Skin: Skin is warm. No erythema.  Nursing note and vitals reviewed.   ED Course  Procedures (including critical care time) Labs  Review Labs Reviewed - No data to display  Imaging Review Ct Head Wo Contrast  10/22/2015  CLINICAL DATA:  Unwitnessed fall. No loss of consciousness. No deviation from baseline. Patient ambulated on the scene. Combative. EXAM: CT HEAD WITHOUT CONTRAST CT CERVICAL SPINE WITHOUT CONTRAST TECHNIQUE: Multidetector CT imaging of the head and cervical spine was performed following the standard protocol without intravenous contrast. Multiplanar CT image reconstructions of the cervical spine were also generated. COMPARISON:  09/26/2013 FINDINGS: CT HEAD FINDINGS Examination is somewhat limited due to motion artifact.There is prominent diffuse cerebral atrophy. Ventricular dilatation consistent with central atrophy. Subcutaneous scalp hematoma over the left frontal region. No mass effect or midline shift. No abnormal extra-axial fluid collections. Gray-white matter junctions are distinct. Basal cisterns are not effaced. No evidence of acute intracranial hemorrhage. No depressed skull fractures. Visualized paranasal sinuses and mastoid air cells are not opacified. Vascular calcifications. CT CERVICAL SPINE FINDINGS Examination is technically limited due to motion artifact. There is about 2.5 mm anterior subluxation of C3 on C4. This is new since the previous study and ligamentous injury should be excluded. Consider MRI for further evaluation. Head is tilted towards the right. This could be due to patient positioning but muscle spasm could also have this appearance. Degenerative changes in the cervical spine with narrowed cervical interspaces and endplate hypertrophic changes. No vertebral compression deformities. No prevertebral soft tissue swelling. Visualized posterior elements appear intact. Degenerative changes throughout the facet joints. C1-2 articulation appears intact with degenerative changes present. Vascular calcifications in the cervical carotid arteries. C7-T1 interspace is not fully imaged and remains  indeterminate. IMPRESSION: No acute intracranial abnormalities.  Prominent chronic atrophy. Evaluation of cervical spine is limited due to severe motion artifact. There is mild anterior subluxation of C3 on C4 which was not present previously and ligamentous injury should be excluded. Head is tilted towards the right, probably positional but can't exclude muscle spasm. Electronically Signed   By: Burman Nieves M.D.   On: 10/22/2015 22:52   Ct Cervical Spine Wo Contrast  10/22/2015  CLINICAL DATA:  Unwitnessed fall. No loss of consciousness. No deviation from baseline. Patient ambulated on the scene. Combative. EXAM: CT HEAD WITHOUT CONTRAST CT CERVICAL SPINE WITHOUT CONTRAST TECHNIQUE: Multidetector CT imaging of the head and cervical spine was performed following the standard protocol without intravenous contrast. Multiplanar CT image reconstructions of the cervical spine were also generated. COMPARISON:  09/26/2013 FINDINGS: CT HEAD FINDINGS Examination is somewhat limited due to motion artifact.There is prominent diffuse cerebral atrophy. Ventricular dilatation consistent with central atrophy. Subcutaneous scalp hematoma over the left frontal region. No mass effect or midline shift. No abnormal extra-axial fluid collections. Gray-white matter junctions are distinct. Basal cisterns are not effaced. No evidence of acute intracranial hemorrhage. No depressed skull fractures. Visualized paranasal sinuses and mastoid air cells are not opacified. Vascular calcifications. CT CERVICAL SPINE FINDINGS Examination is technically limited due to motion artifact. There is about 2.5 mm anterior subluxation of C3 on C4.  This is new since the previous study and ligamentous injury should be excluded. Consider MRI for further evaluation. Head is tilted towards the right. This could be due to patient positioning but muscle spasm could also have this appearance. Degenerative changes in the cervical spine with narrowed cervical  interspaces and endplate hypertrophic changes. No vertebral compression deformities. No prevertebral soft tissue swelling. Visualized posterior elements appear intact. Degenerative changes throughout the facet joints. C1-2 articulation appears intact with degenerative changes present. Vascular calcifications in the cervical carotid arteries. C7-T1 interspace is not fully imaged and remains indeterminate. IMPRESSION: No acute intracranial abnormalities.  Prominent chronic atrophy. Evaluation of cervical spine is limited due to severe motion artifact. There is mild anterior subluxation of C3 on C4 which was not present previously and ligamentous injury should be excluded. Head is tilted towards the right, probably positional but can't exclude muscle spasm. Electronically Signed   By: Burman NievesWilliam  Stevens M.D.   On: 10/22/2015 22:52   I have personally reviewed and evaluated these images and lab results as part of my medical decision-making.   EKG Interpretation None      MDM   Final diagnoses:  Fall, initial encounter  Head injury, initial encounter    Patient brought in by EMS from [facility]. Patient had unwitnessed fall no apparent loss of consciousness. Patient's mental status without any deviation from baseline. Patient was ambulating there and ambulating here. She arrived combative known that she bites it sink scratches his baseline for her. EMS had to give her 5 mg of Haldol at the 8:20 PM. Patient did settle down with that we are able to get CT head and neck completed. Patient ambulating here fine. Patient with a contusion to the left for head measuring about 3 x 3 cm. No other obvious complaints. However patient  does have a history of Alzheimer's disease. According nursing home she is baseline.    Vanetta MuldersScott Merrill Villarruel, MD 10/22/15 2254  CT of the head without any acute intracranial injury no evidence of any skull fracture. CT of the cervical spine showed some mild anterior  subluxation of C3 on C4 that was not present previously and did raise some concern for possible ligamentous injury. Patient however is been ambulating around here without any difficulty in moving her head without any stiffness has no posterior neck tenderness. Clinically feel that it's unlikely that there is a ligamental injury. Will discharge patient back to nursing facility with the caveat that if she seems to have any effect difficulties to have her return for reevaluation. Patient's fall was unwitnessed. Patient's mental status is seem to be baseline. Patient stable for discharge back to nursing facility.  Vanetta MuldersScott Michaelpaul Apo, MD 10/22/15 [phone]

## 2015-10-22 NOTE — ED Notes (Signed)
Per EMS, [facility] is [facility].  Patient had an unwitnessed fall.  No LOC noted.  No devitation from baseline.  Patient ambulated on seen.  She is extremely combative.  She bites , hits, and scratches.  EMS administered 5mg  of Haodol in left hip around 8:20 pm.  Patient was placed in soft retrains.  Towel around neck for c-spine mobilization.    CBG: 138  BP:136/71 P: 90 on room air R:18

## 2015-10-22 NOTE — ED Notes (Signed)
Bed: [id number] Expected date:  Expected time:  Means of arrival:  Comments: EMS 81yo Fall Hematoma

## 2015-10-22 NOTE — ED Notes (Signed)
[facility] called for transport.  

## 2015-10-22 NOTE — ED Notes (Signed)
Patient is a very poor historian due to mental status.  She wouldn't answer any triage questions.  She started to hit me when I asked to access her vital signs.

## 2016-03-28 ENCOUNTER — Encounter (HOSPITAL_COMMUNITY): Payer: Self-pay | Admitting: Emergency Medicine

## 2016-03-28 ENCOUNTER — Emergency Department (HOSPITAL_COMMUNITY)
Admission: EM | Admit: 2016-03-28 | Discharge: 2016-03-29 | Disposition: A | Discharge status: Home / Self Care | Payer: Medicare Other | Attending: Emergency Medicine | Admitting: Emergency Medicine

## 2016-03-28 DIAGNOSIS — Y288XXA Contact with other sharp object, undetermined intent, initial encounter: Secondary | ICD-10-CM | POA: Diagnosis not present

## 2016-03-28 DIAGNOSIS — G20 Parkinson's disease: Secondary | ICD-10-CM | POA: Diagnosis not present

## 2016-03-28 DIAGNOSIS — F329 Major depressive disorder, single episode, unspecified: Secondary | ICD-10-CM | POA: Diagnosis not present

## 2016-03-28 DIAGNOSIS — Y9289 Other specified places as the place of occurrence of the external cause: Secondary | ICD-10-CM | POA: Diagnosis not present

## 2016-03-28 DIAGNOSIS — Z79899 Other long term (current) drug therapy: Secondary | ICD-10-CM | POA: Insufficient documentation

## 2016-03-28 DIAGNOSIS — E785 Hyperlipidemia, unspecified: Secondary | ICD-10-CM | POA: Insufficient documentation

## 2016-03-28 DIAGNOSIS — S41111A Laceration without foreign body of right upper arm, initial encounter: Secondary | ICD-10-CM | POA: Insufficient documentation

## 2016-03-28 DIAGNOSIS — Y9389 Activity, other specified: Secondary | ICD-10-CM | POA: Diagnosis not present

## 2016-03-28 DIAGNOSIS — Y998 Other external cause status: Secondary | ICD-10-CM | POA: Diagnosis not present

## 2016-03-28 DIAGNOSIS — I10 Essential (primary) hypertension: Secondary | ICD-10-CM | POA: Insufficient documentation

## 2016-03-28 DIAGNOSIS — F0390 Unspecified dementia without behavioral disturbance: Secondary | ICD-10-CM | POA: Insufficient documentation

## 2016-03-28 MED ORDER — LIDOCAINE HCL 2 % IJ SOLN
10.0000 mL | Freq: Once | INTRAMUSCULAR | Status: AC
  Administered 2016-03-29: 200 mg
  Filled 2016-03-28: qty 20

## 2016-03-28 MED ORDER — HALOPERIDOL LACTATE 5 MG/ML IJ SOLN
5.0000 mg | Freq: Once | INTRAMUSCULAR | Status: AC
  Administered 2016-03-29: 5 mg via INTRAMUSCULAR
  Filled 2016-03-28: qty 1

## 2016-03-28 NOTE — ED Notes (Signed)
MD at bedside. 

## 2016-03-28 NOTE — ED Provider Notes (Signed)
CSN: [id number]     Arrival date & time 03/28/16  2331 History  By signing my name below, I, Rebekah Mcdowell, attest that this documentation has been prepared under the direction and in the presence of Dione Boozeavid Dasan Hardman, MD. Electronically Signed: Budd PalmerVanessa Mcdowell, ED Scribe. 03/29/2016. 12:35 AM.    Chief Complaint  Patient presents with  . Extremity Laceration   The history is provided by the EMS personnel. No language interpreter was used.   HPI Comments: Level 5 Caveat (Dementia) Rebekah Mcdowell is a 82 y.o. female with a PMHx of Alzheimer's disease, depression, HTN, and HLD brought in by ambulance, who presents to the Emergency Department complaining of a 4 cm laceration to the lateral aspect of the right upper arm sustained this evening. Per EMS, pt comes from [facility], where she was found walking around the facility with the bloody laceration. They note that the bleeding has been successfully controlled.   Past Medical History  Diagnosis Date  . Alzheimer disease   . Hypertension   . Depression   . Hyperlipidemia    History reviewed. No pertinent past surgical history. No family history on file. Social History  Substance Use Topics  . Smoking status: Never Smoker   . Smokeless tobacco: Never Used  . Alcohol Use: No   OB History    No data available     Review of Systems  Unable to perform ROS: Dementia    Allergies  Review of patient's allergies indicates no known allergies.  Home Medications   Prior to Admission medications   Medication Sig Start Date End Date Taking? Authorizing Provider  acetaminophen (TYLENOL) 500 MG tablet Take 500 mg by mouth 2 (two) times daily.   Yes Historical Provider, MD  cholecalciferol (VITAMIN D) 400 UNITS TABS tablet Take 400 Units by mouth daily.   Yes Historical Provider, MD  divalproex (DEPAKOTE SPRINKLE) 125 MG capsule Take 125 mg by mouth 2 (two) times daily.    Yes Historical Provider, MD  ENSURE PLUS (ENSURE PLUS) LIQD Take  237 mLs by mouth 2 (two) times daily between meals.    Yes Historical Provider, MD  ferrous sulfate 220 (44 FE) MG/5ML solution Take 330 mg by mouth daily.   Yes Historical Provider, MD  levothyroxine (SYNTHROID, LEVOTHROID) 25 MCG tablet Take 12.5 mcg by mouth daily before breakfast.   Yes Historical Provider, MD  LORazepam (ATIVAN) 0.5 MG tablet Take 0.5 mg by mouth 2 (two) times daily as needed (agitation).   Yes Historical Provider, MD  QUEtiapine (SEROQUEL) 25 MG tablet Take 12.5 mg by mouth at bedtime.   Yes Historical Provider, MD  saccharomyces boulardii (FLORASTOR) 250 MG capsule Take 250 mg by mouth 2 (two) times daily.   Yes Historical Provider, MD   There were no vitals taken for this visit. Physical Exam  Constitutional: She is oriented to person, place, and time. She appears well-developed and well-nourished.  HENT:  Head: Normocephalic and atraumatic.  Eyes: Conjunctivae and EOM are normal. Pupils are equal, round, and reactive to light. Right eye exhibits no discharge. Left eye exhibits no discharge.  Neck: Normal range of motion. Neck supple. No JVD present.  Cardiovascular: Normal rate, regular rhythm and normal heart sounds.   No murmur heard. Pulmonary/Chest: Effort normal and breath sounds normal. She has no wheezes. She has no rales. She exhibits no tenderness.  Abdominal: Soft. Bowel sounds are normal. She exhibits no distension and no mass. There is no tenderness.  Musculoskeletal: Normal range  of motion. She exhibits no edema.  Laceration to the lateral aspect of the R upper arm, NVI, no other injuries seen  Lymphadenopathy:    She has no cervical adenopathy.  Neurological: She is alert and oriented to person, place, and time. No cranial nerve deficit. She exhibits normal muscle tone. Coordination normal.  Awake and alert, but not oriented. Pt is uncooperative  Skin: Skin is warm and dry. No rash noted. She is not diaphoretic. No erythema.  Psychiatric:  Disoriented  and frequently agitated.  Nursing note and vitals reviewed.   ED Course  Procedures   COORDINATION OF CARE: 11:59 PM - Discussed plans to numb, cleanse, and suture the wound. Pt advised of plan for treatment and pt agrees.  LACERATION REPAIR Performed by: RUEAV,WUJWJ Authorized by: XBJYN,WGNFA Consent: Verbal consent obtained. Risks and benefits: risks, benefits and alternatives were discussed Consent given by: patient Patient identity confirmed: provided demographic data Prepped and Draped in normal sterile fashion Wound explored  Laceration Location: Right upper arm  Laceration Length: 8 cm  No Foreign Bodies seen or palpated  Anesthesia: local infiltration  Local anesthetic: lidocaine 2% without epinephrine  Anesthetic total: 4 ml  Amount of cleaning: standard  Skin closure: Close  Number of staples: 12  Technique: Surgical stapling  Patient tolerance: Patient tolerated the procedure well with no immediate complications.  MDM   Final diagnoses:  Laceration of right upper arm, initial encounter    Laceration of the right upper arm of uncertain etiology. No other injuries seen. Old records are reviewed and she has had similar ED visits for similar issues. Tetanus immunization was given August 20 15,014, so no need for tetanus booster today. Laceration is closed with surgical stapling. Because of her degree of uncooperativeness, she did require haloperidol for sedation. Staples are to be removed in 7 days.  I personally performed the services described in this documentation, which was scribed in my presence. The recorded information has been reviewed and is accurate.      Dione Booze, MD 03/29/16 [phone]

## 2016-03-28 NOTE — ED Notes (Addendum)
Pt BIB EMS from [facility]; pt was found walking around facility with 4 inch laceration to the posterior right upper arm; pt has advanced dementia; bleeding controlled; pt is disoriented x4 which [facility] facility states is pt's baseline.

## 2016-03-28 NOTE — ED Notes (Signed)
Delay in obtaining vital signs due to pt being uncooperative

## 2016-03-29 NOTE — ED Notes (Signed)
MD at bedside. 

## 2016-03-29 NOTE — Discharge Instructions (Signed)
Please go to your Primary Care Physician, an Urgent Care or return to the Emergency Department to have your staples or sutures removed 7 -10 days from today.   Laceration Care, Adult A laceration is a cut that goes through all of the layers of the skin and into the tissue that is right under the skin. Some lacerations heal on their own. Others need to be closed with stitches (sutures), staples, skin adhesive strips, or skin glue. Proper laceration care minimizes the risk of infection and helps the laceration to heal better. HOW TO CARE FOR YOUR LACERATION If sutures or staples were used:  Keep the wound clean and dry.  If you were given a bandage (dressing), you should change it at least one time per day or as told by your health care provider. You should also change it if it becomes wet or dirty.  Keep the wound completely dry for the first 24 hours or as told by your health care provider. After that time, you may shower or bathe. However, make sure that the wound is not soaked in water until after the sutures or staples have been removed.  Clean the wound one time each day or as told by your health care provider:  Wash the wound with soap and water.  Rinse the wound with water to remove all soap.  Pat the wound dry with a clean towel. Do not rub the wound.  After cleaning the wound, apply a thin layer of antibiotic ointmentas told by your health care provider. This will help to prevent infection and keep the dressing from sticking to the wound.  Have the sutures or staples removed as told by your health care provider. If skin adhesive strips were used:  Keep the wound clean and dry.  If you were given a bandage (dressing), you should change it at least one time per day or as told by your health care provider. You should also change it if it becomes dirty or wet.  Do not get the skin adhesive strips wet. You may shower or bathe, but be careful to keep the wound dry.  If the wound  gets wet, pat it dry with a clean towel. Do not rub the wound.  Skin adhesive strips fall off on their own. You may trim the strips as the wound heals. Do not remove skin adhesive strips that are still stuck to the wound. They will fall off in time. If skin glue was used:  Try to keep the wound dry, but you may briefly wet it in the shower or bath. Do not soak the wound in water, such as by swimming.  After you have showered or bathed, gently pat the wound dry with a clean towel. Do not rub the wound.  Do not do any activities that will make you sweat heavily until the skin glue has fallen off on its own.  Do not apply liquid, cream, or ointment medicine to the wound while the skin glue is in place. Using those may loosen the film before the wound has healed.  If you were given a bandage (dressing), you should change it at least one time per day or as told by your health care provider. You should also change it if it becomes dirty or wet.  If a dressing is placed over the wound, be careful not to apply tape directly over the skin glue. Doing that may cause the glue to be pulled off before the wound has healed.  Do not pick at the glue. The skin glue usually remains in place for 5-10 days, then it falls off of the skin. General Instructions  Take over-the-counter and prescription medicines only as told by your health care provider.  If you were prescribed an antibiotic medicine or ointment, take or apply it as told by your doctor. Do not stop using it even if your condition improves.  To help prevent scarring, make sure to cover your wound with sunscreen whenever you are outside after stitches are removed, after adhesive strips are removed, or when glue remains in place and the wound is healed. Make sure to wear a sunscreen of at least 30 SPF.  Do not scratch or pick at the wound.  Keep all follow-up visits as told by your health care provider. This is important.  Check your wound every  day for signs of infection. Watch for:  Redness, swelling, or pain.  Fluid, blood, or pus.  Raise (elevate) the injured area above the level of your heart while you are sitting or lying down, if possible. SEEK MEDICAL CARE IF:  You received a tetanus shot and you have swelling, severe pain, redness, or bleeding at the injection site.  You have a fever.  A wound that was closed breaks open.  You notice a bad smell coming from your wound or your dressing.  You notice something coming out of the wound, such as wood or glass.  Your pain is not controlled with medicine.  You have increased redness, swelling, or pain at the site of your wound.  You have fluid, blood, or pus coming from your wound.  You notice a change in the color of your skin near your wound.  You need to change the dressing frequently due to fluid, blood, or pus draining from the wound.  You develop a new rash.  You develop numbness around the wound. SEEK IMMEDIATE MEDICAL CARE IF:  You develop severe swelling around the wound.  Your pain suddenly increases and is severe.  You develop painful lumps near the wound or on skin that is anywhere on your body.  You have a red streak going away from your wound.  The wound is on your hand or foot and you cannot properly move a finger or toe.  The wound is on your hand or foot and you notice that your fingers or toes look pale or bluish.   This information is not intended to replace advice given to you by your health care provider. Make sure you discuss any questions you have with your health care provider.   Document Released: 12/05/2005 Document Revised: 04/21/2015 Document Reviewed: 12/01/2014 Elsevier Interactive Patient Education [employer]

## 2016-03-29 NOTE — ED Notes (Signed)
Dressing applied to pts arm.  

## 2016-03-29 NOTE — ED Notes (Signed)
[facility]

## 2016-03-29 NOTE — ED Notes (Signed)
Called nursing home twice for report; line busy

## 2017-06-25 ENCOUNTER — Emergency Department (HOSPITAL_COMMUNITY): Payer: Medicare Other

## 2017-06-25 ENCOUNTER — Emergency Department (HOSPITAL_COMMUNITY)
Admission: EM | Admit: 2017-06-25 | Discharge: 2017-06-25 | Disposition: A | Discharge status: Home / Self Care | Payer: Medicare Other | Attending: Emergency Medicine | Admitting: Emergency Medicine

## 2017-06-25 ENCOUNTER — Encounter (HOSPITAL_COMMUNITY): Payer: Self-pay | Admitting: Emergency Medicine

## 2017-06-25 DIAGNOSIS — G309 Alzheimer's disease, unspecified: Secondary | ICD-10-CM | POA: Insufficient documentation

## 2017-06-25 DIAGNOSIS — S0990XA Unspecified injury of head, initial encounter: Secondary | ICD-10-CM

## 2017-06-25 DIAGNOSIS — Y999 Unspecified external cause status: Secondary | ICD-10-CM | POA: Diagnosis not present

## 2017-06-25 DIAGNOSIS — Y92129 Unspecified place in nursing home as the place of occurrence of the external cause: Secondary | ICD-10-CM | POA: Insufficient documentation

## 2017-06-25 DIAGNOSIS — S0081XA Abrasion of other part of head, initial encounter: Secondary | ICD-10-CM | POA: Insufficient documentation

## 2017-06-25 DIAGNOSIS — W1839XA Other fall on same level, initial encounter: Secondary | ICD-10-CM | POA: Diagnosis not present

## 2017-06-25 DIAGNOSIS — I10 Essential (primary) hypertension: Secondary | ICD-10-CM | POA: Insufficient documentation

## 2017-06-25 DIAGNOSIS — Y939 Activity, unspecified: Secondary | ICD-10-CM | POA: Insufficient documentation

## 2017-06-25 DIAGNOSIS — S80212A Abrasion, left knee, initial encounter: Secondary | ICD-10-CM | POA: Diagnosis not present

## 2017-06-25 DIAGNOSIS — Z79899 Other long term (current) drug therapy: Secondary | ICD-10-CM | POA: Diagnosis not present

## 2017-06-25 DIAGNOSIS — F0281 Dementia in other diseases classified elsewhere with behavioral disturbance: Secondary | ICD-10-CM | POA: Diagnosis not present

## 2017-06-25 MED ORDER — LORAZEPAM 2 MG/ML IJ SOLN
INTRAMUSCULAR | Status: AC
  Filled 2017-06-25: qty 1

## 2017-06-25 MED ORDER — LORAZEPAM 2 MG/ML IJ SOLN
1.0000 mg | Freq: Once | INTRAMUSCULAR | Status: AC
  Administered 2017-06-25: 1 mg via INTRAMUSCULAR

## 2017-06-25 NOTE — ED Notes (Signed)
PT HAS DNR-YELLOW COPY PRESENT 

## 2017-06-25 NOTE — ED Notes (Signed)
Pt is combative and repeatedly trying to get out of bed. Pt's speech is garbled and has word salad (this is baseline) and pt is not redirectable. Attempts were made to distract and redirect patient. These were all unsuccessful. Pt is not steady on her feet and is attempting to strike and scratch staff. EDP notified of these behaviors.

## 2017-06-25 NOTE — ED Notes (Signed)
[facility] Expected date:  Expected time:  Means of arrival:  Comments: 83 yo fall,
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
## 2017-06-25 NOTE — ED Notes (Addendum)
Pt back from CT pt resting with eyes closed. Respirations even and unlabored. No distress noted. Will continue to monitor
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
## 2017-06-25 NOTE — Discharge Instructions (Signed)
Return here as needed. Follow up with your doctor. °

## 2017-06-25 NOTE — ED Notes (Signed)
[facility] TRANSPORT GIVEN DNR YELLOW COPY

## 2017-06-25 NOTE — ED Notes (Signed)
Patient transported to CT 

## 2017-07-04 ENCOUNTER — Emergency Department (HOSPITAL_COMMUNITY)
Admission: EM | Admit: 2017-07-04 | Discharge: 2017-07-05 | Disposition: A | Discharge status: Home / Self Care | Payer: Medicare Other | Attending: Emergency Medicine | Admitting: Emergency Medicine

## 2017-07-04 ENCOUNTER — Emergency Department (HOSPITAL_COMMUNITY): Payer: Medicare Other

## 2017-07-04 DIAGNOSIS — S0990XA Unspecified injury of head, initial encounter: Secondary | ICD-10-CM | POA: Diagnosis present

## 2017-07-04 DIAGNOSIS — G309 Alzheimer's disease, unspecified: Secondary | ICD-10-CM | POA: Diagnosis not present

## 2017-07-04 DIAGNOSIS — I10 Essential (primary) hypertension: Secondary | ICD-10-CM | POA: Diagnosis not present

## 2017-07-04 DIAGNOSIS — W19XXXA Unspecified fall, initial encounter: Secondary | ICD-10-CM | POA: Diagnosis not present

## 2017-07-04 DIAGNOSIS — S0101XA Laceration without foreign body of scalp, initial encounter: Secondary | ICD-10-CM | POA: Insufficient documentation

## 2017-07-04 DIAGNOSIS — Z79899 Other long term (current) drug therapy: Secondary | ICD-10-CM | POA: Insufficient documentation

## 2017-07-04 DIAGNOSIS — Y9389 Activity, other specified: Secondary | ICD-10-CM | POA: Insufficient documentation

## 2017-07-04 DIAGNOSIS — Y999 Unspecified external cause status: Secondary | ICD-10-CM | POA: Insufficient documentation

## 2017-07-04 DIAGNOSIS — Y92129 Unspecified place in nursing home as the place of occurrence of the external cause: Secondary | ICD-10-CM | POA: Insufficient documentation

## 2017-07-04 MED ORDER — LORAZEPAM 2 MG/ML IJ SOLN
1.0000 mg | Freq: Once | INTRAMUSCULAR | Status: AC
  Administered 2017-07-04: 1 mg via INTRAMUSCULAR
  Filled 2017-07-04: qty 1

## 2017-07-04 MED ORDER — BACITRACIN ZINC 500 UNIT/GM EX OINT: 1.0000 "application " | TOPICAL_OINTMENT | Freq: Two times a day (BID) | CUTANEOUS | 1 refills | Status: AC
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
## 2017-07-04 NOTE — ED Notes (Signed)
[facility] notified of need for transport back to facility.
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
## 2017-07-04 NOTE — Discharge Instructions (Signed)
5 staples were placed to the right side of her head. These need to be removed in about 7 days. Please watch for signs of infection. CT scan of her head and neck here showed no acute findings.

## 2017-07-04 NOTE — ED Notes (Signed)
Bed: [id number] Expected date:  Expected time:  Means of arrival:  Comments: EMS 83 yo [facility]

## 2017-07-04 NOTE — ED Notes (Signed)
Patient would not allow us to undress her or allow provider to examine her. Pt given 1mg  ativan. Patient continues to try to get out of bed. Redirected and moved to hall bed for better visualization.

## 2017-07-05 NOTE — ED Notes (Signed)
Bed: [facility] Expected date:  Expected time:  Means of arrival:  Comments: 

## 2019-11-24 ENCOUNTER — Encounter (HOSPITAL_COMMUNITY): Payer: Self-pay

## 2019-11-24 ENCOUNTER — Emergency Department (HOSPITAL_COMMUNITY)
Admission: EM | Admit: 2019-11-24 | Discharge: 2019-11-25 | Disposition: A | Discharge status: Home / Self Care | Payer: Medicare Other | Attending: Emergency Medicine | Admitting: Emergency Medicine

## 2019-11-24 ENCOUNTER — Other Ambulatory Visit: Payer: Self-pay

## 2019-11-24 DIAGNOSIS — Z23 Encounter for immunization: Secondary | ICD-10-CM | POA: Diagnosis not present

## 2019-11-24 DIAGNOSIS — Y999 Unspecified external cause status: Secondary | ICD-10-CM | POA: Insufficient documentation

## 2019-11-24 DIAGNOSIS — S0990XA Unspecified injury of head, initial encounter: Secondary | ICD-10-CM

## 2019-11-24 DIAGNOSIS — S0003XA Contusion of scalp, initial encounter: Secondary | ICD-10-CM | POA: Insufficient documentation

## 2019-11-24 DIAGNOSIS — I10 Essential (primary) hypertension: Secondary | ICD-10-CM | POA: Diagnosis not present

## 2019-11-24 DIAGNOSIS — Y92122 Bedroom in nursing home as the place of occurrence of the external cause: Secondary | ICD-10-CM | POA: Insufficient documentation

## 2019-11-24 DIAGNOSIS — W06XXXA Fall from bed, initial encounter: Secondary | ICD-10-CM | POA: Diagnosis not present

## 2019-11-24 DIAGNOSIS — Z79899 Other long term (current) drug therapy: Secondary | ICD-10-CM | POA: Insufficient documentation

## 2019-11-24 DIAGNOSIS — W19XXXA Unspecified fall, initial encounter: Secondary | ICD-10-CM

## 2019-11-24 DIAGNOSIS — S0001XA Abrasion of scalp, initial encounter: Secondary | ICD-10-CM | POA: Insufficient documentation

## 2019-11-24 DIAGNOSIS — G309 Alzheimer's disease, unspecified: Secondary | ICD-10-CM | POA: Diagnosis not present

## 2019-11-24 DIAGNOSIS — Y939 Activity, unspecified: Secondary | ICD-10-CM | POA: Insufficient documentation

## 2019-11-24 DIAGNOSIS — Y92129 Unspecified place in nursing home as the place of occurrence of the external cause: Secondary | ICD-10-CM

## 2019-11-24 MED ORDER — TETANUS-DIPHTH-ACELL PERTUSSIS 5-2.5-18.5 LF-MCG/0.5 IM SUSP
0.5000 mL | Freq: Once | INTRAMUSCULAR | Status: AC
  Administered 2019-11-24: 0.5 mL via INTRAMUSCULAR
  Filled 2019-11-24: qty 0.5
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
## 2019-11-24 NOTE — ED Provider Notes (Signed)
[facility] DEPT Provider Note   CSN: [id number] Arrival date & time: 11/24/19  2151     History   Chief Complaint Chief Complaint  Patient presents with  . Fall  . Head Laceration    HPI Rebekah Mcdowell is a 85 y.o. female.     85 year old female with past medical history below including Alzheimer's dementia who presents with fall and head injury.  EMS reports that the patient had an unwitnessed fall at her nursing facility this evening.  She apparently rolled out of bed and staff found her on the floor.  It is unclear whether she lost consciousness.  She had some bleeding from the back of her head that is currently controlled.  Staff reported that she does not take any anticoagulation.  She is disoriented at baseline.  LEVEL 5 CAVEAT DUE TO DEMENTIA  The history is provided by the EMS personnel and the nursing home.  Fall  Head Laceration    Past Medical History:  Diagnosis Date  . Alzheimer disease ([place])   . Depression   . Hyperlipidemia   . Hypertension     There are no active problems to display for this patient.   No past surgical history on file.   OB History   No obstetric history on file.      Home Medications    Prior to Admission medications   Medication Sig Start Date End Date Taking? Authorizing Provider  acetaminophen (TYLENOL) 500 MG tablet Take 500 mg by mouth 2 (two) times daily.    [provider]  bacitracin ointment Apply 1 application topically 2 (two) times daily. 07/04/17   Waynetta Pean, PA-C  chlorhexidine (PERIDEX) 0.12 % solution Use as directed 15 mLs in the mouth or throat 2 (two) times daily.    [provider]  cholecalciferol (VITAMIN D) 400 UNITS TABS tablet Take 400 Units by mouth daily.    [provider]  divalproex (DEPAKOTE SPRINKLE) 125 MG capsule Take 125 mg by mouth 2 (two) times daily.     [provider]  ENSURE PLUS (ENSURE PLUS) LIQD Take 237 mLs by  mouth 2 (two) times daily between meals.     [provider]  ferrous sulfate 220 (44 FE) MG/5ML solution Take 330 mg by mouth daily. On Monday, Wednesday, Friday    [provider]  levothyroxine (SYNTHROID, LEVOTHROID) 25 MCG tablet Take 12.5 mcg by mouth daily before breakfast.    [provider]  LORazepam (ATIVAN) 0.5 MG tablet Take 0.5 mg by mouth 2 (two) times daily as needed (agitation).    [provider]  QUEtiapine (SEROQUEL) 25 MG tablet Take 12.5-25 mg by mouth 2 (two) times daily. 12.5 mg in morning, 25 mg at bedtime    [provider]  saccharomyces boulardii (FLORASTOR) 250 MG capsule Take 250 mg by mouth 2 (two) times daily.    [provider]    Family History No family history on file.  Social History Social History   Tobacco Use  . Smoking status: Never Smoker  . Smokeless tobacco: Never Used  Substance Use Topics  . Alcohol use: No  . Drug use: No     Allergies   Patient has no known allergies.   Review of Systems Review of Systems  Unable to perform ROS: Dementia     Physical Exam Updated Vital Signs BP (!) 158/70 (BP Location: Left Arm)   Pulse 92   Temp 98 F (36.7 C) (  Axillary)   Resp 18   SpO2 100%   Physical Exam Vitals signs and nursing note reviewed.  Constitutional:      General: She is not in acute distress.    Appearance: She is well-developed.     Comments: Frail, cachectic elderly woman   HENT:     Head: Normocephalic.     Comments: Hematoma w/ small abrasion R parietal scalp, no active bleeding    Nose: Nose normal.  Eyes:     Conjunctiva/sclera: Conjunctivae normal.     Pupils: Pupils are equal, round, and reactive to light.  Neck:     Comments: In c-collar, trachea midline Cardiovascular:     Rate and Rhythm: Normal rate and regular rhythm.     Heart sounds: Normal heart sounds. No murmur.  Pulmonary:     Effort: Pulmonary effort is normal.     Breath sounds: Normal  breath sounds.  Chest:     Chest wall: No tenderness.  Abdominal:     General: Bowel sounds are normal. There is no distension.     Palpations: Abdomen is soft.     Tenderness: There is no abdominal tenderness.  Musculoskeletal: Normal range of motion.        General: No swelling, tenderness or deformity.  Skin:    General: Skin is warm and dry.  Neurological:     Mental Status: She is alert.     Comments: Non-sensical speech, moving arms freely, disoriented  Psychiatric:     Comments: Mildly agitated      ED Treatments / Results  Labs (all labs ordered are listed, but only abnormal results are displayed) Labs Reviewed - No data to display  EKG None  Radiology No results found.  Procedures Procedures (including critical care time)  Medications Ordered in ED Medications  Tdap (BOOSTRIX) injection 0.5 mL (has no administration in time range)     Initial Impression / Assessment and Plan / ED Course  I have reviewed the triage vital signs and the nursing notes.  Pertinent imaging results that were available during my care of the patient were reviewed by me and considered in my medical decision making (see chart for details).       Scalp abrasion/hematoma on exam, no active bleeding. Last Td 2014; ordered tdap booster. I have ordered CT head and c-spine and am signing out to oncoming provider pending imaging results.  Final Clinical Impressions(s) / ED Diagnoses   Final diagnoses:  None    ED Discharge Orders    None       Little, Ambrose Finland, MD 11/24/19 [phone]

## 2019-11-25 ENCOUNTER — Emergency Department (HOSPITAL_COMMUNITY): Payer: Medicare Other

## 2019-11-25 NOTE — ED Provider Notes (Signed)
Care assumed from Dr. Clarene Duke, patient with dementia presenting with fall pending CT of head.  CT of head and cervical spine showed no acute injury.  She is discharged back to her skilled care facility.  Results for orders placed or performed during the hospital encounter of 09/26/13  CBC with Differential  Result Value Ref Range   WBC 7.8 4.0 - 10.5 K/uL   RBC 4.00 3.87 - 5.11 MIL/uL   Hemoglobin 12.2 12.0 - 15.0 g/dL   HCT 59.5 63.8 - 75.6 %   MCV 93.5 78.0 - 100.0 fL   MCH 30.5 26.0 - 34.0 pg   MCHC 32.6 30.0 - 36.0 g/dL   RDW 43.3 29.5 - 18.8 %   Platelets 293 150 - 400 K/uL   Neutrophils Relative % 41 (L) 43 - 77 %   Neutro Abs 3.2 1.7 - 7.7 K/uL   Lymphocytes Relative 34 12 - 46 %   Lymphs Abs 2.6 0.7 - 4.0 K/uL   Monocytes Relative 9 3 - 12 %   Monocytes Absolute 0.7 0.1 - 1.0 K/uL   Eosinophils Relative 15 (H) 0 - 5 %   Eosinophils Absolute 1.2 (H) 0.0 - 0.7 K/uL   Basophils Relative 1 0 - 1 %   Basophils Absolute 0.1 0.0 - 0.1 K/uL  Comprehensive metabolic panel  Result Value Ref Range   Sodium 143 135 - 145 mEq/L   Potassium 3.9 3.5 - 5.1 mEq/L   Chloride 105 96 - 112 mEq/L   CO2 27 19 - 32 mEq/L   Glucose, Bld 93 70 - 99 mg/dL   BUN 29 (H) 6 - 23 mg/dL   Creatinine, Ser 4.16 0.50 - 1.10 mg/dL   Calcium 9.5 8.4 - 60.6 mg/dL   Total Protein 7.1 6.0 - 8.3 g/dL   Albumin 3.8 3.5 - 5.2 g/dL   AST 31 0 - 37 U/L   ALT 18 0 - 35 U/L   Alkaline Phosphatase 73 39 - 117 U/L   Total Bilirubin 0.2 (L) 0.3 - 1.2 mg/dL   GFR calc non Af Amer 72 (L) >90 mL/min   GFR calc Af Amer 84 (L) >90 mL/min  Valproic acid level  Result Value Ref Range   Valproic Acid Lvl <10.0 (L) 50.0 - 100.0 ug/mL  POCT i-Stat troponin I  Result Value Ref Range   Troponin i, poc 0.01 0.00 - 0.08 ng/mL   Comment 3           Ct Head Wo Contrast  Result Date: 11/25/2019 CLINICAL DATA:  Unwitnessed fall. Head laceration. EXAM: CT HEAD WITHOUT CONTRAST CT CERVICAL SPINE WITHOUT CONTRAST TECHNIQUE:  Multidetector CT imaging of the head and cervical spine was performed following the standard protocol without intravenous contrast. Multiplanar CT image reconstructions of the cervical spine were also generated. COMPARISON:  July 05, 2017 FINDINGS: CT HEAD FINDINGS Brain: No evidence of acute infarction, hemorrhage, hydrocephalus, extra-axial collection or mass lesion/mass effect. Advanced atrophy and chronic microvascular ischemic changes are noted. Vascular: No hyperdense vessel or unexpected calcification. Skull: Normal. Negative for fracture or focal lesion. There is right posterior scalp swelling without evidence for an underlying fracture. Sinuses/Orbits: No acute finding. Other: None. CT CERVICAL SPINE FINDINGS Alignment: Normal. Skull base and vertebrae: No acute fracture. No primary bone lesion or focal pathologic process. Soft tissues and spinal canal: No prevertebral fluid or swelling. No visible canal hematoma. Disc levels:  There is mild multilevel disc height loss. Upper chest: Negative. Other: None IMPRESSION: 1.  Right posterior scalp swelling without evidence for underlying fracture or acute intracranial process. 2. Advanced atrophy and chronic microvascular ischemic changes of the brain. 3. No evidence for acute cervical spine fracture or subluxation. Electronically Signed   By: Constance Holster M.D.   On: 11/25/2019 00:44   Ct Cervical Spine Wo Contrast  Result Date: 11/25/2019 CLINICAL DATA:  Unwitnessed fall. Head laceration. EXAM: CT HEAD WITHOUT CONTRAST CT CERVICAL SPINE WITHOUT CONTRAST TECHNIQUE: Multidetector CT imaging of the head and cervical spine was performed following the standard protocol without intravenous contrast. Multiplanar CT image reconstructions of the cervical spine were also generated. COMPARISON:  July 05, 2017 FINDINGS: CT HEAD FINDINGS Brain: No evidence of acute infarction, hemorrhage, hydrocephalus, extra-axial collection or mass lesion/mass effect. Advanced  atrophy and chronic microvascular ischemic changes are noted. Vascular: No hyperdense vessel or unexpected calcification. Skull: Normal. Negative for fracture or focal lesion. There is right posterior scalp swelling without evidence for an underlying fracture. Sinuses/Orbits: No acute finding. Other: None. CT CERVICAL SPINE FINDINGS Alignment: Normal. Skull base and vertebrae: No acute fracture. No primary bone lesion or focal pathologic process. Soft tissues and spinal canal: No prevertebral fluid or swelling. No visible canal hematoma. Disc levels:  There is mild multilevel disc height loss. Upper chest: Negative. Other: None IMPRESSION: 1. Right posterior scalp swelling without evidence for underlying fracture or acute intracranial process. 2. Advanced atrophy and chronic microvascular ischemic changes of the brain. 3. No evidence for acute cervical spine fracture or subluxation. Electronically Signed   By: Constance Holster M.D.   On: 02/72/5366 44:03     Delora Fuel, MD 47/42/59 [phone]

## 2019-11-25 NOTE — ED Notes (Signed)
[facility] contacted for transport to [facility]. Paperwork printed and at nursing station.

## 2019-11-25 NOTE — ED Notes (Signed)
[facility] transported patient back to facility.

## 2019-11-25 NOTE — ED Notes (Signed)
Patient transported to CT 

## 2021-05-19 DEATH — deceased
